# Patient Record
Sex: Female | Born: 1940
Health system: Southern US, Community
[De-identification: ages and names within clinical notes are randomized; demographics above are authoritative.]

## PROBLEM LIST (undated history)

## (undated) DIAGNOSIS — E079 Disorder of thyroid, unspecified: Secondary | ICD-10-CM

## (undated) DIAGNOSIS — H1010 Acute atopic conjunctivitis, unspecified eye: Secondary | ICD-10-CM

## (undated) DIAGNOSIS — I714 Abdominal aortic aneurysm, without rupture, unspecified: Secondary | ICD-10-CM

## (undated) DIAGNOSIS — F32A Depression, unspecified: Secondary | ICD-10-CM

## (undated) DIAGNOSIS — F329 Major depressive disorder, single episode, unspecified: Secondary | ICD-10-CM

## (undated) DIAGNOSIS — L219 Seborrheic dermatitis, unspecified: Secondary | ICD-10-CM

## (undated) DIAGNOSIS — S22000A Wedge compression fracture of unspecified thoracic vertebra, initial encounter for closed fracture: Secondary | ICD-10-CM

## (undated) DIAGNOSIS — J309 Allergic rhinitis, unspecified: Secondary | ICD-10-CM

## (undated) DIAGNOSIS — C801 Malignant (primary) neoplasm, unspecified: Secondary | ICD-10-CM

## (undated) DIAGNOSIS — E039 Hypothyroidism, unspecified: Secondary | ICD-10-CM

## (undated) DIAGNOSIS — E785 Hyperlipidemia, unspecified: Secondary | ICD-10-CM

## (undated) DIAGNOSIS — I10 Essential (primary) hypertension: Secondary | ICD-10-CM

## (undated) DIAGNOSIS — M069 Rheumatoid arthritis, unspecified: Secondary | ICD-10-CM

## (undated) HISTORY — DX: Hyperlipidemia, unspecified: E78.5

## (undated) HISTORY — DX: Acute atopic conjunctivitis, unspecified eye: H10.10

## (undated) HISTORY — PX: APPENDECTOMY: SHX54

## (undated) HISTORY — DX: Seborrheic dermatitis, unspecified: L21.9

## (undated) HISTORY — DX: Malignant (primary) neoplasm, unspecified: C80.1

## (undated) HISTORY — PX: BACK SURGERY: SHX140

## (undated) HISTORY — DX: Allergic rhinitis, unspecified: J30.9

## (undated) HISTORY — PX: OTHER SURGICAL HISTORY: SHX169

## (undated) HISTORY — DX: Wedge compression fracture of unspecified thoracic vertebra, initial encounter for closed fracture: S22.000A

## (undated) HISTORY — DX: Rheumatoid arthritis, unspecified: M06.9

## (undated) HISTORY — DX: Hypothyroidism, unspecified: E03.9

---

## 2006-02-22 DIAGNOSIS — E039 Hypothyroidism, unspecified: Secondary | ICD-10-CM

## 2006-02-22 HISTORY — DX: Hypothyroidism, unspecified: E03.9

## 2008-11-07 ENCOUNTER — Emergency Department (HOSPITAL_COMMUNITY): Admission: EM | Admit: 2008-11-07 | Discharge: 2008-11-07 | Payer: Self-pay | Admitting: Emergency Medicine

## 2009-09-02 ENCOUNTER — Ambulatory Visit (HOSPITAL_BASED_OUTPATIENT_CLINIC_OR_DEPARTMENT_OTHER): Admission: RE | Admit: 2009-09-02 | Discharge: 2009-09-02 | Payer: Self-pay | Admitting: Surgery

## 2011-01-20 LAB — POCT I-STAT 4, (NA,K, GLUC, HGB,HCT)
Glucose, Bld: 80 mg/dL (ref 70–99)
HCT: 35 % — ABNORMAL LOW (ref 36.0–46.0)
Hemoglobin: 11.9 g/dL — ABNORMAL LOW (ref 12.0–15.0)
Potassium: 3.5 mEq/L (ref 3.5–5.1)
Sodium: 140 mEq/L (ref 135–145)

## 2012-04-06 DIAGNOSIS — I10 Essential (primary) hypertension: Secondary | ICD-10-CM | POA: Diagnosis not present

## 2012-04-06 DIAGNOSIS — E039 Hypothyroidism, unspecified: Secondary | ICD-10-CM | POA: Diagnosis not present

## 2012-04-06 DIAGNOSIS — Z6828 Body mass index (BMI) 28.0-28.9, adult: Secondary | ICD-10-CM | POA: Diagnosis not present

## 2012-04-06 DIAGNOSIS — N390 Urinary tract infection, site not specified: Secondary | ICD-10-CM | POA: Diagnosis not present

## 2012-04-06 DIAGNOSIS — Z0000 Encounter for general adult medical examination without abnormal findings: Secondary | ICD-10-CM | POA: Diagnosis not present

## 2012-05-08 DIAGNOSIS — E7800 Pure hypercholesterolemia, unspecified: Secondary | ICD-10-CM | POA: Diagnosis not present

## 2012-05-08 DIAGNOSIS — E876 Hypokalemia: Secondary | ICD-10-CM | POA: Diagnosis not present

## 2012-05-16 DIAGNOSIS — H524 Presbyopia: Secondary | ICD-10-CM | POA: Diagnosis not present

## 2012-05-16 DIAGNOSIS — H2510 Age-related nuclear cataract, unspecified eye: Secondary | ICD-10-CM | POA: Diagnosis not present

## 2012-07-10 DIAGNOSIS — W57XXXA Bitten or stung by nonvenomous insect and other nonvenomous arthropods, initial encounter: Secondary | ICD-10-CM | POA: Diagnosis not present

## 2012-07-10 DIAGNOSIS — T148 Other injury of unspecified body region: Secondary | ICD-10-CM | POA: Diagnosis not present

## 2012-07-31 DIAGNOSIS — H60399 Other infective otitis externa, unspecified ear: Secondary | ICD-10-CM | POA: Diagnosis not present

## 2012-07-31 DIAGNOSIS — H9190 Unspecified hearing loss, unspecified ear: Secondary | ICD-10-CM | POA: Diagnosis not present

## 2012-07-31 DIAGNOSIS — H9209 Otalgia, unspecified ear: Secondary | ICD-10-CM | POA: Diagnosis not present

## 2012-07-31 DIAGNOSIS — J342 Deviated nasal septum: Secondary | ICD-10-CM | POA: Diagnosis not present

## 2012-08-04 DIAGNOSIS — H60399 Other infective otitis externa, unspecified ear: Secondary | ICD-10-CM | POA: Diagnosis not present

## 2012-08-04 DIAGNOSIS — H9209 Otalgia, unspecified ear: Secondary | ICD-10-CM | POA: Diagnosis not present

## 2012-08-09 DIAGNOSIS — Z23 Encounter for immunization: Secondary | ICD-10-CM | POA: Diagnosis not present

## 2012-08-09 DIAGNOSIS — J019 Acute sinusitis, unspecified: Secondary | ICD-10-CM | POA: Diagnosis not present

## 2012-08-09 DIAGNOSIS — J209 Acute bronchitis, unspecified: Secondary | ICD-10-CM | POA: Diagnosis not present

## 2012-08-30 DIAGNOSIS — H60399 Other infective otitis externa, unspecified ear: Secondary | ICD-10-CM | POA: Diagnosis not present

## 2012-08-30 DIAGNOSIS — R42 Dizziness and giddiness: Secondary | ICD-10-CM | POA: Diagnosis not present

## 2012-09-26 DIAGNOSIS — H00039 Abscess of eyelid unspecified eye, unspecified eyelid: Secondary | ICD-10-CM | POA: Diagnosis not present

## 2012-09-26 DIAGNOSIS — H1030 Unspecified acute conjunctivitis, unspecified eye: Secondary | ICD-10-CM | POA: Diagnosis not present

## 2012-09-26 DIAGNOSIS — H60399 Other infective otitis externa, unspecified ear: Secondary | ICD-10-CM | POA: Diagnosis not present

## 2012-10-06 DIAGNOSIS — E782 Mixed hyperlipidemia: Secondary | ICD-10-CM | POA: Diagnosis not present

## 2012-10-06 DIAGNOSIS — I10 Essential (primary) hypertension: Secondary | ICD-10-CM | POA: Diagnosis not present

## 2012-10-06 DIAGNOSIS — E039 Hypothyroidism, unspecified: Secondary | ICD-10-CM | POA: Diagnosis not present

## 2012-10-06 DIAGNOSIS — F329 Major depressive disorder, single episode, unspecified: Secondary | ICD-10-CM | POA: Diagnosis not present

## 2012-10-06 DIAGNOSIS — E7800 Pure hypercholesterolemia, unspecified: Secondary | ICD-10-CM | POA: Diagnosis not present

## 2012-10-31 DIAGNOSIS — E782 Mixed hyperlipidemia: Secondary | ICD-10-CM | POA: Diagnosis not present

## 2012-10-31 DIAGNOSIS — J019 Acute sinusitis, unspecified: Secondary | ICD-10-CM | POA: Diagnosis not present

## 2012-11-28 DIAGNOSIS — J019 Acute sinusitis, unspecified: Secondary | ICD-10-CM | POA: Diagnosis not present

## 2013-02-16 DIAGNOSIS — J3489 Other specified disorders of nose and nasal sinuses: Secondary | ICD-10-CM | POA: Diagnosis not present

## 2013-02-16 DIAGNOSIS — H60399 Other infective otitis externa, unspecified ear: Secondary | ICD-10-CM | POA: Diagnosis not present

## 2013-04-06 DIAGNOSIS — E039 Hypothyroidism, unspecified: Secondary | ICD-10-CM | POA: Diagnosis not present

## 2013-04-06 DIAGNOSIS — I10 Essential (primary) hypertension: Secondary | ICD-10-CM | POA: Diagnosis not present

## 2013-04-06 DIAGNOSIS — F329 Major depressive disorder, single episode, unspecified: Secondary | ICD-10-CM | POA: Diagnosis not present

## 2013-04-06 DIAGNOSIS — E782 Mixed hyperlipidemia: Secondary | ICD-10-CM | POA: Diagnosis not present

## 2013-04-23 DIAGNOSIS — M2550 Pain in unspecified joint: Secondary | ICD-10-CM | POA: Diagnosis not present

## 2013-04-23 DIAGNOSIS — W57XXXA Bitten or stung by nonvenomous insect and other nonvenomous arthropods, initial encounter: Secondary | ICD-10-CM | POA: Diagnosis not present

## 2013-04-23 DIAGNOSIS — T148 Other injury of unspecified body region: Secondary | ICD-10-CM | POA: Diagnosis not present

## 2013-04-23 DIAGNOSIS — M719 Bursopathy, unspecified: Secondary | ICD-10-CM | POA: Diagnosis not present

## 2013-04-23 DIAGNOSIS — M67919 Unspecified disorder of synovium and tendon, unspecified shoulder: Secondary | ICD-10-CM | POA: Diagnosis not present

## 2013-04-23 DIAGNOSIS — R5381 Other malaise: Secondary | ICD-10-CM | POA: Diagnosis not present

## 2013-04-23 DIAGNOSIS — R5383 Other fatigue: Secondary | ICD-10-CM | POA: Diagnosis not present

## 2013-05-01 DIAGNOSIS — M719 Bursopathy, unspecified: Secondary | ICD-10-CM | POA: Diagnosis not present

## 2013-05-01 DIAGNOSIS — M2550 Pain in unspecified joint: Secondary | ICD-10-CM | POA: Diagnosis not present

## 2013-05-01 DIAGNOSIS — M67919 Unspecified disorder of synovium and tendon, unspecified shoulder: Secondary | ICD-10-CM | POA: Diagnosis not present

## 2013-05-01 DIAGNOSIS — Z79899 Other long term (current) drug therapy: Secondary | ICD-10-CM | POA: Diagnosis not present

## 2013-05-01 DIAGNOSIS — IMO0001 Reserved for inherently not codable concepts without codable children: Secondary | ICD-10-CM | POA: Diagnosis not present

## 2013-05-01 DIAGNOSIS — R5381 Other malaise: Secondary | ICD-10-CM | POA: Diagnosis not present

## 2013-05-01 DIAGNOSIS — R5383 Other fatigue: Secondary | ICD-10-CM | POA: Diagnosis not present

## 2013-05-06 ENCOUNTER — Emergency Department (HOSPITAL_COMMUNITY)
Admission: EM | Admit: 2013-05-06 | Discharge: 2013-05-06 | Disposition: A | Discharge status: Home / Self Care | Payer: Medicare Other | Attending: Emergency Medicine | Admitting: Emergency Medicine

## 2013-05-06 ENCOUNTER — Encounter (HOSPITAL_COMMUNITY): Payer: Self-pay | Admitting: Emergency Medicine

## 2013-05-06 DIAGNOSIS — Z85828 Personal history of other malignant neoplasm of skin: Secondary | ICD-10-CM | POA: Insufficient documentation

## 2013-05-06 DIAGNOSIS — M2540 Effusion, unspecified joint: Secondary | ICD-10-CM | POA: Insufficient documentation

## 2013-05-06 DIAGNOSIS — M25579 Pain in unspecified ankle and joints of unspecified foot: Secondary | ICD-10-CM | POA: Diagnosis not present

## 2013-05-06 DIAGNOSIS — Z87891 Personal history of nicotine dependence: Secondary | ICD-10-CM | POA: Diagnosis not present

## 2013-05-06 DIAGNOSIS — I10 Essential (primary) hypertension: Secondary | ICD-10-CM | POA: Diagnosis not present

## 2013-05-06 DIAGNOSIS — E079 Disorder of thyroid, unspecified: Secondary | ICD-10-CM | POA: Insufficient documentation

## 2013-05-06 DIAGNOSIS — E785 Hyperlipidemia, unspecified: Secondary | ICD-10-CM | POA: Insufficient documentation

## 2013-05-06 DIAGNOSIS — Z888 Allergy status to other drugs, medicaments and biological substances status: Secondary | ICD-10-CM | POA: Diagnosis not present

## 2013-05-06 DIAGNOSIS — M2550 Pain in unspecified joint: Secondary | ICD-10-CM | POA: Diagnosis not present

## 2013-05-06 DIAGNOSIS — F329 Major depressive disorder, single episode, unspecified: Secondary | ICD-10-CM | POA: Diagnosis not present

## 2013-05-06 DIAGNOSIS — Z91040 Latex allergy status: Secondary | ICD-10-CM | POA: Insufficient documentation

## 2013-05-06 DIAGNOSIS — K5900 Constipation, unspecified: Secondary | ICD-10-CM | POA: Insufficient documentation

## 2013-05-06 DIAGNOSIS — M25569 Pain in unspecified knee: Secondary | ICD-10-CM | POA: Diagnosis not present

## 2013-05-06 DIAGNOSIS — Z881 Allergy status to other antibiotic agents status: Secondary | ICD-10-CM | POA: Diagnosis not present

## 2013-05-06 DIAGNOSIS — F3289 Other specified depressive episodes: Secondary | ICD-10-CM | POA: Insufficient documentation

## 2013-05-06 DIAGNOSIS — Z882 Allergy status to sulfonamides status: Secondary | ICD-10-CM | POA: Insufficient documentation

## 2013-05-06 HISTORY — DX: Depression, unspecified: F32.A

## 2013-05-06 HISTORY — DX: Essential (primary) hypertension: I10

## 2013-05-06 HISTORY — DX: Disorder of thyroid, unspecified: E07.9

## 2013-05-06 HISTORY — DX: Major depressive disorder, single episode, unspecified: F32.9

## 2013-05-06 MED ORDER — OXYCODONE-ACETAMINOPHEN 5-325 MG PO TABS: 1.0000 | ORAL_TABLET | Freq: Four times a day (QID) | ORAL | Status: DC | PRN

## 2013-05-06 MED ORDER — ONDANSETRON 4 MG PO TBDP
8.0000 mg | ORAL_TABLET | Freq: Once | ORAL | Status: AC
  Administered 2013-05-06: 8 mg via ORAL
  Filled 2013-05-06: qty 2

## 2013-05-06 MED ORDER — PREDNISONE 20 MG PO TABS: ORAL_TABLET | ORAL | Status: DC

## 2013-05-06 MED ORDER — PREDNISONE 20 MG PO TABS
60.0000 mg | ORAL_TABLET | Freq: Once | ORAL | Status: AC
  Administered 2013-05-06: 60 mg via ORAL
  Filled 2013-05-06: qty 3

## 2013-05-06 MED ORDER — ONDANSETRON HCL 4 MG PO TABS: 4.0000 mg | ORAL_TABLET | Freq: Four times a day (QID) | ORAL | Status: DC

## 2013-05-06 MED ORDER — OXYCODONE-ACETAMINOPHEN 5-325 MG PO TABS
2.0000 | ORAL_TABLET | Freq: Once | ORAL | Status: AC
  Administered 2013-05-06: 2 via ORAL
  Filled 2013-05-06: qty 2

## 2013-05-06 NOTE — ED Notes (Signed)
Pt states pain to joints all over body. States pain/swelling has increased over the last week. 5/10 pain at the time. No obvious deformities noted. States spider bite to L leg several weeks ago, was seen at PCP for same. Pt is alert and oriented x4. No signs of distress noted at the time.

## 2013-05-06 NOTE — ED Provider Notes (Signed)
Medical screening examination/treatment/procedure(s) were conducted as a shared visit with non-physician practitioner(s) and myself.  I personally evaluated the patient during the encounter  Patient seen and examined. Joints are mildly inflamed. Will restart her corticosteroid and discharged home patient are as follows schedule with the rheumatologist  Toy Baker, MD 05/06/13 1118

## 2013-05-06 NOTE — ED Provider Notes (Signed)
History    CSN: [id number] Arrival date & time 05/06/13  1019  First MD Initiated Contact with Patient 05/06/13 1039     Chief Complaint  Patient presents with  . Joint Pain   (Consider location/radiation/quality/duration/timing/severity/associated sxs/prior Treatment) HPI Comments: Patient is a 72 year old female with history of hypertension, hyperlipidemia, hypothyroidism, skin cancer who presents today with joint pain diffusely in all of her joints. 2 weeks ago she was bitten by a suspected spider. The actual bites have healed. She was seen by her primary care physician and prescribed steroids and antibiotics. At that time she was worked up with CBC, ANA, sed rate, RMSF which she was told all came back normally. She initially began to feel better while on the 6 day taper of prednisone. She has been taking hydrocodone with minimal relief.  Her symptoms have gradually began to worsen in the past week. No fevers, chills, nausea, vomiting, abdominal pain, diarrhea. She does note her last BM was 3 days ago. She struggles with constipation.   The history is provided by the patient. No language interpreter was used.   Past Medical History  Diagnosis Date  . Hypertension   . Thyroid disease   . Depression    History reviewed. No pertinent past surgical history. No family history on file. History  Substance Use Topics  . Smoking status: Former Games developer  . Smokeless tobacco: Not on file  . Alcohol Use: No   OB History   Grav Para Term Preterm Abortions TAB SAB Ect Mult Living                 Review of Systems  Constitutional: Negative for fever and chills.  Respiratory: Negative for shortness of breath.   Cardiovascular: Negative for chest pain.  Gastrointestinal: Positive for constipation. Negative for nausea, vomiting and abdominal pain.  Musculoskeletal: Positive for myalgias, joint swelling and arthralgias.  Skin: Negative for rash.  All other systems reviewed and are  negative.    Allergies  Amoxicillin; Gemfibrozil; Latex; and Sulfa antibiotics  Home Medications  No current outpatient prescriptions on file. BP 148/82  Pulse 74  Temp(Src) 97.8 F (36.6 C) (Oral)  Resp 18  Ht 5\' 4"  (1.626 m)  Wt 159 lb (72.122 kg)  BMI 27.28 kg/m2  SpO2 100% Physical Exam  Nursing note and vitals reviewed. Constitutional: She is oriented to person, place, and time. She appears well-developed and well-nourished. No distress.  HENT:  Head: Normocephalic and atraumatic.  Right Ear: External ear normal.  Left Ear: External ear normal.  Nose: Nose normal.  Mouth/Throat: Oropharynx is clear and moist.  Eyes: Conjunctivae are normal.  Neck: Normal range of motion.  Cardiovascular: Normal rate, regular rhythm, normal heart sounds, intact distal pulses and normal pulses.   Pulmonary/Chest: Effort normal and breath sounds normal. No stridor. No respiratory distress. She has no wheezes. She has no rales.  Abdominal: Soft. She exhibits no distension.  Musculoskeletal: Normal range of motion.       Right knee: She exhibits swelling.       Left knee: She exhibits swelling.       Right ankle: She exhibits swelling.       Left ankle: She exhibits swelling.       Right hand: She exhibits tenderness and swelling. She exhibits normal capillary refill and no deformity.       Left hand: She exhibits tenderness and swelling. She exhibits normal capillary refill and no deformity.  Neurological: She is alert and  oriented to person, place, and time. She has normal strength.  Skin: Skin is warm and dry. No rash noted. She is not diaphoretic. No erythema.  Psychiatric: She has a normal mood and affect. Her behavior is normal.    ED Course  Procedures (including critical care time) Labs Reviewed - No data to display No results found. 1. Joint pain     MDM  Patient with diffuse joint pain and tenderness. Pain improved with steroids. Worked up with appropriate tests at [facility]  office. Currently on doxycyline. Will give additional steroids today. Recommend follow up with PCP and possible rheumatologist. Dr. Freida Busman evaluated patient and agrees with plan. Return instructions given. Vital signs stable for discharge. Patient / Family / Caregiver informed of clinical course, understand medical decision-making process, and agree with plan.   Mora Bellman, PA-C 05/06/13 1141

## 2013-05-06 NOTE — ED Provider Notes (Signed)
Medical screening examination/treatment/procedure(s) were performed by non-physician practitioner and as supervising physician I was immediately available for consultation/collaboration.  Toy Baker, MD 05/06/13 [phone]

## 2013-05-06 NOTE — ED Notes (Addendum)
Pt c/o pain and swelling to joints. Pt was bit by spider 2 weeks ago and was seen and treated by her PMD. Symptoms increased in past week. Pt also reports feeling fatigue.

## 2013-05-10 DIAGNOSIS — M2550 Pain in unspecified joint: Secondary | ICD-10-CM | POA: Diagnosis not present

## 2013-05-10 DIAGNOSIS — K625 Hemorrhage of anus and rectum: Secondary | ICD-10-CM | POA: Diagnosis not present

## 2013-05-10 DIAGNOSIS — K5900 Constipation, unspecified: Secondary | ICD-10-CM | POA: Diagnosis not present

## 2013-05-17 DIAGNOSIS — H524 Presbyopia: Secondary | ICD-10-CM | POA: Diagnosis not present

## 2013-05-17 DIAGNOSIS — H2589 Other age-related cataract: Secondary | ICD-10-CM | POA: Diagnosis not present

## 2013-05-22 DIAGNOSIS — M79609 Pain in unspecified limb: Secondary | ICD-10-CM | POA: Diagnosis not present

## 2013-05-22 DIAGNOSIS — M7989 Other specified soft tissue disorders: Secondary | ICD-10-CM | POA: Diagnosis not present

## 2013-05-22 DIAGNOSIS — M2550 Pain in unspecified joint: Secondary | ICD-10-CM | POA: Diagnosis not present

## 2013-06-13 DIAGNOSIS — M2550 Pain in unspecified joint: Secondary | ICD-10-CM | POA: Diagnosis not present

## 2013-06-13 DIAGNOSIS — M79609 Pain in unspecified limb: Secondary | ICD-10-CM | POA: Diagnosis not present

## 2013-06-13 DIAGNOSIS — M069 Rheumatoid arthritis, unspecified: Secondary | ICD-10-CM | POA: Diagnosis not present

## 2013-07-06 DIAGNOSIS — J019 Acute sinusitis, unspecified: Secondary | ICD-10-CM | POA: Diagnosis not present

## 2013-07-13 DIAGNOSIS — H353190 Nonexudative age-related macular degeneration, unspecified eye, stage unspecified: Secondary | ICD-10-CM | POA: Diagnosis not present

## 2013-07-13 DIAGNOSIS — H40039 Anatomical narrow angle, unspecified eye: Secondary | ICD-10-CM | POA: Diagnosis not present

## 2013-07-25 DIAGNOSIS — M069 Rheumatoid arthritis, unspecified: Secondary | ICD-10-CM | POA: Diagnosis not present

## 2013-07-31 DIAGNOSIS — H259 Unspecified age-related cataract: Secondary | ICD-10-CM | POA: Diagnosis not present

## 2013-07-31 DIAGNOSIS — H2589 Other age-related cataract: Secondary | ICD-10-CM | POA: Diagnosis not present

## 2013-07-31 DIAGNOSIS — I10 Essential (primary) hypertension: Secondary | ICD-10-CM | POA: Diagnosis not present

## 2013-07-31 DIAGNOSIS — E039 Hypothyroidism, unspecified: Secondary | ICD-10-CM | POA: Diagnosis not present

## 2013-07-31 DIAGNOSIS — Z87891 Personal history of nicotine dependence: Secondary | ICD-10-CM | POA: Diagnosis not present

## 2013-08-08 DIAGNOSIS — Z23 Encounter for immunization: Secondary | ICD-10-CM | POA: Diagnosis not present

## 2013-08-15 DIAGNOSIS — Z9181 History of falling: Secondary | ICD-10-CM | POA: Diagnosis not present

## 2013-08-15 DIAGNOSIS — R05 Cough: Secondary | ICD-10-CM | POA: Diagnosis not present

## 2013-08-15 DIAGNOSIS — R42 Dizziness and giddiness: Secondary | ICD-10-CM | POA: Diagnosis not present

## 2013-08-15 DIAGNOSIS — F329 Major depressive disorder, single episode, unspecified: Secondary | ICD-10-CM | POA: Diagnosis not present

## 2013-09-24 DIAGNOSIS — M069 Rheumatoid arthritis, unspecified: Secondary | ICD-10-CM | POA: Diagnosis not present

## 2013-10-09 DIAGNOSIS — I10 Essential (primary) hypertension: Secondary | ICD-10-CM | POA: Diagnosis not present

## 2013-10-09 DIAGNOSIS — Z79899 Other long term (current) drug therapy: Secondary | ICD-10-CM | POA: Diagnosis not present

## 2013-10-09 DIAGNOSIS — E782 Mixed hyperlipidemia: Secondary | ICD-10-CM | POA: Diagnosis not present

## 2013-10-09 DIAGNOSIS — Z0000 Encounter for general adult medical examination without abnormal findings: Secondary | ICD-10-CM | POA: Diagnosis not present

## 2013-10-09 DIAGNOSIS — E039 Hypothyroidism, unspecified: Secondary | ICD-10-CM | POA: Diagnosis not present

## 2013-10-09 DIAGNOSIS — G609 Hereditary and idiopathic neuropathy, unspecified: Secondary | ICD-10-CM | POA: Diagnosis not present

## 2013-12-11 DIAGNOSIS — Z79899 Other long term (current) drug therapy: Secondary | ICD-10-CM | POA: Diagnosis not present

## 2013-12-11 DIAGNOSIS — J019 Acute sinusitis, unspecified: Secondary | ICD-10-CM | POA: Diagnosis not present

## 2013-12-11 DIAGNOSIS — M069 Rheumatoid arthritis, unspecified: Secondary | ICD-10-CM | POA: Diagnosis not present

## 2014-01-23 DIAGNOSIS — M069 Rheumatoid arthritis, unspecified: Secondary | ICD-10-CM | POA: Diagnosis not present

## 2014-01-23 DIAGNOSIS — M2550 Pain in unspecified joint: Secondary | ICD-10-CM | POA: Diagnosis not present

## 2014-02-08 DIAGNOSIS — R252 Cramp and spasm: Secondary | ICD-10-CM | POA: Diagnosis not present

## 2014-02-14 DIAGNOSIS — H524 Presbyopia: Secondary | ICD-10-CM | POA: Diagnosis not present

## 2014-02-14 DIAGNOSIS — Z961 Presence of intraocular lens: Secondary | ICD-10-CM | POA: Diagnosis not present

## 2014-02-22 DIAGNOSIS — H1010 Acute atopic conjunctivitis, unspecified eye: Secondary | ICD-10-CM | POA: Diagnosis not present

## 2014-02-22 DIAGNOSIS — R197 Diarrhea, unspecified: Secondary | ICD-10-CM | POA: Diagnosis not present

## 2014-03-25 DIAGNOSIS — M069 Rheumatoid arthritis, unspecified: Secondary | ICD-10-CM | POA: Diagnosis not present

## 2014-03-25 DIAGNOSIS — M2550 Pain in unspecified joint: Secondary | ICD-10-CM | POA: Diagnosis not present

## 2014-04-09 DIAGNOSIS — E039 Hypothyroidism, unspecified: Secondary | ICD-10-CM | POA: Diagnosis not present

## 2014-04-09 DIAGNOSIS — F3289 Other specified depressive episodes: Secondary | ICD-10-CM | POA: Diagnosis not present

## 2014-04-09 DIAGNOSIS — I10 Essential (primary) hypertension: Secondary | ICD-10-CM | POA: Diagnosis not present

## 2014-04-09 DIAGNOSIS — E782 Mixed hyperlipidemia: Secondary | ICD-10-CM | POA: Diagnosis not present

## 2014-04-09 DIAGNOSIS — Z79899 Other long term (current) drug therapy: Secondary | ICD-10-CM | POA: Diagnosis not present

## 2014-04-09 DIAGNOSIS — F329 Major depressive disorder, single episode, unspecified: Secondary | ICD-10-CM | POA: Diagnosis not present

## 2014-04-10 DIAGNOSIS — H905 Unspecified sensorineural hearing loss: Secondary | ICD-10-CM | POA: Diagnosis not present

## 2014-04-10 DIAGNOSIS — R42 Dizziness and giddiness: Secondary | ICD-10-CM | POA: Diagnosis not present

## 2014-04-10 DIAGNOSIS — J342 Deviated nasal septum: Secondary | ICD-10-CM | POA: Diagnosis not present

## 2014-04-10 DIAGNOSIS — H8110 Benign paroxysmal vertigo, unspecified ear: Secondary | ICD-10-CM | POA: Diagnosis not present

## 2014-04-16 DIAGNOSIS — H8110 Benign paroxysmal vertigo, unspecified ear: Secondary | ICD-10-CM | POA: Diagnosis not present

## 2014-04-16 DIAGNOSIS — H903 Sensorineural hearing loss, bilateral: Secondary | ICD-10-CM | POA: Diagnosis not present

## 2014-04-16 DIAGNOSIS — R42 Dizziness and giddiness: Secondary | ICD-10-CM | POA: Diagnosis not present

## 2014-04-16 DIAGNOSIS — H9319 Tinnitus, unspecified ear: Secondary | ICD-10-CM | POA: Diagnosis not present

## 2014-04-18 DIAGNOSIS — H903 Sensorineural hearing loss, bilateral: Secondary | ICD-10-CM | POA: Diagnosis not present

## 2014-04-18 DIAGNOSIS — H8110 Benign paroxysmal vertigo, unspecified ear: Secondary | ICD-10-CM | POA: Diagnosis not present

## 2014-04-24 DIAGNOSIS — H903 Sensorineural hearing loss, bilateral: Secondary | ICD-10-CM | POA: Diagnosis not present

## 2014-04-24 DIAGNOSIS — R42 Dizziness and giddiness: Secondary | ICD-10-CM | POA: Diagnosis not present

## 2014-05-10 DIAGNOSIS — H903 Sensorineural hearing loss, bilateral: Secondary | ICD-10-CM | POA: Diagnosis not present

## 2014-05-10 DIAGNOSIS — H8110 Benign paroxysmal vertigo, unspecified ear: Secondary | ICD-10-CM | POA: Diagnosis not present

## 2014-05-10 DIAGNOSIS — J342 Deviated nasal septum: Secondary | ICD-10-CM | POA: Diagnosis not present

## 2014-05-14 DIAGNOSIS — J9600 Acute respiratory failure, unspecified whether with hypoxia or hypercapnia: Secondary | ICD-10-CM | POA: Diagnosis present

## 2014-05-14 DIAGNOSIS — R918 Other nonspecific abnormal finding of lung field: Secondary | ICD-10-CM | POA: Diagnosis not present

## 2014-05-14 DIAGNOSIS — R0789 Other chest pain: Secondary | ICD-10-CM | POA: Diagnosis not present

## 2014-05-14 DIAGNOSIS — J449 Chronic obstructive pulmonary disease, unspecified: Secondary | ICD-10-CM | POA: Diagnosis not present

## 2014-05-14 DIAGNOSIS — I10 Essential (primary) hypertension: Secondary | ICD-10-CM | POA: Diagnosis not present

## 2014-05-14 DIAGNOSIS — R0902 Hypoxemia: Secondary | ICD-10-CM | POA: Diagnosis not present

## 2014-05-14 DIAGNOSIS — R9431 Abnormal electrocardiogram [ECG] [EKG]: Secondary | ICD-10-CM | POA: Diagnosis not present

## 2014-05-14 DIAGNOSIS — J189 Pneumonia, unspecified organism: Secondary | ICD-10-CM | POA: Diagnosis not present

## 2014-05-14 DIAGNOSIS — F341 Dysthymic disorder: Secondary | ICD-10-CM | POA: Diagnosis not present

## 2014-05-14 DIAGNOSIS — Z23 Encounter for immunization: Secondary | ICD-10-CM | POA: Diagnosis not present

## 2014-05-14 DIAGNOSIS — R079 Chest pain, unspecified: Secondary | ICD-10-CM | POA: Diagnosis not present

## 2014-05-14 DIAGNOSIS — Z87891 Personal history of nicotine dependence: Secondary | ICD-10-CM | POA: Diagnosis not present

## 2014-05-14 DIAGNOSIS — J438 Other emphysema: Secondary | ICD-10-CM | POA: Diagnosis not present

## 2014-05-14 DIAGNOSIS — J984 Other disorders of lung: Secondary | ICD-10-CM | POA: Diagnosis not present

## 2014-05-14 DIAGNOSIS — R9389 Abnormal findings on diagnostic imaging of other specified body structures: Secondary | ICD-10-CM | POA: Diagnosis not present

## 2014-05-14 DIAGNOSIS — R6883 Chills (without fever): Secondary | ICD-10-CM | POA: Diagnosis not present

## 2014-05-14 DIAGNOSIS — R0602 Shortness of breath: Secondary | ICD-10-CM | POA: Diagnosis not present

## 2014-05-14 DIAGNOSIS — Z79899 Other long term (current) drug therapy: Secondary | ICD-10-CM | POA: Diagnosis not present

## 2014-05-14 DIAGNOSIS — R072 Precordial pain: Secondary | ICD-10-CM | POA: Diagnosis not present

## 2014-05-14 DIAGNOSIS — R930 Abnormal findings on diagnostic imaging of skull and head, not elsewhere classified: Secondary | ICD-10-CM | POA: Diagnosis not present

## 2014-05-14 DIAGNOSIS — E039 Hypothyroidism, unspecified: Secondary | ICD-10-CM | POA: Diagnosis not present

## 2014-05-21 DIAGNOSIS — J189 Pneumonia, unspecified organism: Secondary | ICD-10-CM | POA: Diagnosis not present

## 2014-05-21 DIAGNOSIS — K5900 Constipation, unspecified: Secondary | ICD-10-CM | POA: Diagnosis not present

## 2014-05-21 DIAGNOSIS — E871 Hypo-osmolality and hyponatremia: Secondary | ICD-10-CM | POA: Diagnosis not present

## 2014-05-23 DIAGNOSIS — J449 Chronic obstructive pulmonary disease, unspecified: Secondary | ICD-10-CM | POA: Diagnosis not present

## 2014-05-23 DIAGNOSIS — R918 Other nonspecific abnormal finding of lung field: Secondary | ICD-10-CM | POA: Diagnosis not present

## 2014-06-06 DIAGNOSIS — J3089 Other allergic rhinitis: Secondary | ICD-10-CM | POA: Diagnosis not present

## 2014-06-06 DIAGNOSIS — R918 Other nonspecific abnormal finding of lung field: Secondary | ICD-10-CM | POA: Diagnosis not present

## 2014-06-06 DIAGNOSIS — J45909 Unspecified asthma, uncomplicated: Secondary | ICD-10-CM | POA: Diagnosis not present

## 2014-06-14 DIAGNOSIS — J984 Other disorders of lung: Secondary | ICD-10-CM | POA: Diagnosis not present

## 2014-06-14 DIAGNOSIS — E039 Hypothyroidism, unspecified: Secondary | ICD-10-CM | POA: Diagnosis not present

## 2014-06-19 DIAGNOSIS — J309 Allergic rhinitis, unspecified: Secondary | ICD-10-CM | POA: Diagnosis not present

## 2014-06-19 DIAGNOSIS — R748 Abnormal levels of other serum enzymes: Secondary | ICD-10-CM | POA: Diagnosis not present

## 2014-06-19 DIAGNOSIS — K5900 Constipation, unspecified: Secondary | ICD-10-CM | POA: Diagnosis not present

## 2014-06-19 DIAGNOSIS — E039 Hypothyroidism, unspecified: Secondary | ICD-10-CM | POA: Diagnosis not present

## 2014-06-25 DIAGNOSIS — J3089 Other allergic rhinitis: Secondary | ICD-10-CM | POA: Diagnosis not present

## 2014-06-26 DIAGNOSIS — E870 Hyperosmolality and hypernatremia: Secondary | ICD-10-CM | POA: Diagnosis not present

## 2014-07-01 DIAGNOSIS — M549 Dorsalgia, unspecified: Secondary | ICD-10-CM | POA: Diagnosis not present

## 2014-07-01 DIAGNOSIS — M25549 Pain in joints of unspecified hand: Secondary | ICD-10-CM | POA: Diagnosis not present

## 2014-07-01 DIAGNOSIS — M545 Low back pain, unspecified: Secondary | ICD-10-CM | POA: Diagnosis not present

## 2014-07-01 DIAGNOSIS — M25559 Pain in unspecified hip: Secondary | ICD-10-CM | POA: Diagnosis not present

## 2014-07-01 DIAGNOSIS — M069 Rheumatoid arthritis, unspecified: Secondary | ICD-10-CM | POA: Diagnosis not present

## 2014-07-04 DIAGNOSIS — J309 Allergic rhinitis, unspecified: Secondary | ICD-10-CM | POA: Diagnosis not present

## 2014-07-04 DIAGNOSIS — H1045 Other chronic allergic conjunctivitis: Secondary | ICD-10-CM | POA: Diagnosis not present

## 2014-07-22 DIAGNOSIS — K5900 Constipation, unspecified: Secondary | ICD-10-CM | POA: Diagnosis not present

## 2014-07-22 DIAGNOSIS — R0781 Pleurodynia: Secondary | ICD-10-CM | POA: Diagnosis not present

## 2014-07-22 DIAGNOSIS — E039 Hypothyroidism, unspecified: Secondary | ICD-10-CM | POA: Diagnosis not present

## 2014-07-23 DIAGNOSIS — E039 Hypothyroidism, unspecified: Secondary | ICD-10-CM | POA: Diagnosis not present

## 2014-07-24 DIAGNOSIS — Z9181 History of falling: Secondary | ICD-10-CM | POA: Diagnosis not present

## 2014-07-24 DIAGNOSIS — Z86010 Personal history of colonic polyps: Secondary | ICD-10-CM | POA: Diagnosis not present

## 2014-07-24 DIAGNOSIS — K5900 Constipation, unspecified: Secondary | ICD-10-CM | POA: Diagnosis not present

## 2014-07-24 DIAGNOSIS — R0781 Pleurodynia: Secondary | ICD-10-CM | POA: Diagnosis not present

## 2014-07-25 DIAGNOSIS — S299XXA Unspecified injury of thorax, initial encounter: Secondary | ICD-10-CM | POA: Diagnosis not present

## 2014-07-25 DIAGNOSIS — R0781 Pleurodynia: Secondary | ICD-10-CM | POA: Diagnosis not present

## 2014-08-21 DIAGNOSIS — Z23 Encounter for immunization: Secondary | ICD-10-CM | POA: Diagnosis not present

## 2014-09-09 DIAGNOSIS — Z800 Family history of malignant neoplasm of digestive organs: Secondary | ICD-10-CM | POA: Diagnosis not present

## 2014-09-09 DIAGNOSIS — K5730 Diverticulosis of large intestine without perforation or abscess without bleeding: Secondary | ICD-10-CM | POA: Diagnosis not present

## 2014-09-09 DIAGNOSIS — Z86010 Personal history of colonic polyps: Secondary | ICD-10-CM | POA: Diagnosis not present

## 2014-09-09 DIAGNOSIS — D123 Benign neoplasm of transverse colon: Secondary | ICD-10-CM | POA: Diagnosis not present

## 2014-09-09 DIAGNOSIS — K635 Polyp of colon: Secondary | ICD-10-CM | POA: Diagnosis not present

## 2014-09-30 DIAGNOSIS — M545 Low back pain: Secondary | ICD-10-CM | POA: Diagnosis not present

## 2014-09-30 DIAGNOSIS — M0609 Rheumatoid arthritis without rheumatoid factor, multiple sites: Secondary | ICD-10-CM | POA: Diagnosis not present

## 2014-10-07 DIAGNOSIS — E782 Mixed hyperlipidemia: Secondary | ICD-10-CM | POA: Diagnosis not present

## 2014-10-07 DIAGNOSIS — F329 Major depressive disorder, single episode, unspecified: Secondary | ICD-10-CM | POA: Diagnosis not present

## 2014-10-07 DIAGNOSIS — L219 Seborrheic dermatitis, unspecified: Secondary | ICD-10-CM | POA: Diagnosis not present

## 2014-10-07 DIAGNOSIS — R748 Abnormal levels of other serum enzymes: Secondary | ICD-10-CM | POA: Diagnosis not present

## 2014-10-07 DIAGNOSIS — E039 Hypothyroidism, unspecified: Secondary | ICD-10-CM | POA: Diagnosis not present

## 2014-10-07 DIAGNOSIS — M069 Rheumatoid arthritis, unspecified: Secondary | ICD-10-CM | POA: Diagnosis not present

## 2014-10-07 DIAGNOSIS — R634 Abnormal weight loss: Secondary | ICD-10-CM | POA: Diagnosis not present

## 2014-10-07 DIAGNOSIS — I10 Essential (primary) hypertension: Secondary | ICD-10-CM | POA: Diagnosis not present

## 2014-11-04 DIAGNOSIS — J019 Acute sinusitis, unspecified: Secondary | ICD-10-CM | POA: Diagnosis not present

## 2014-11-04 DIAGNOSIS — Z23 Encounter for immunization: Secondary | ICD-10-CM | POA: Diagnosis not present

## 2014-11-12 DIAGNOSIS — Z1389 Encounter for screening for other disorder: Secondary | ICD-10-CM | POA: Diagnosis not present

## 2014-11-12 DIAGNOSIS — Z9181 History of falling: Secondary | ICD-10-CM | POA: Diagnosis not present

## 2014-11-12 DIAGNOSIS — D485 Neoplasm of uncertain behavior of skin: Secondary | ICD-10-CM | POA: Diagnosis not present

## 2014-11-12 DIAGNOSIS — H6090 Unspecified otitis externa, unspecified ear: Secondary | ICD-10-CM | POA: Diagnosis not present

## 2014-11-12 DIAGNOSIS — E039 Hypothyroidism, unspecified: Secondary | ICD-10-CM | POA: Diagnosis not present

## 2014-12-04 DIAGNOSIS — H6090 Unspecified otitis externa, unspecified ear: Secondary | ICD-10-CM | POA: Diagnosis not present

## 2014-12-04 DIAGNOSIS — J329 Chronic sinusitis, unspecified: Secondary | ICD-10-CM | POA: Diagnosis not present

## 2014-12-09 DIAGNOSIS — M545 Low back pain: Secondary | ICD-10-CM | POA: Diagnosis not present

## 2014-12-09 DIAGNOSIS — M0609 Rheumatoid arthritis without rheumatoid factor, multiple sites: Secondary | ICD-10-CM | POA: Diagnosis not present

## 2014-12-27 DIAGNOSIS — E039 Hypothyroidism, unspecified: Secondary | ICD-10-CM | POA: Diagnosis not present

## 2015-01-07 DIAGNOSIS — K219 Gastro-esophageal reflux disease without esophagitis: Secondary | ICD-10-CM | POA: Diagnosis not present

## 2015-01-07 DIAGNOSIS — R1310 Dysphagia, unspecified: Secondary | ICD-10-CM | POA: Diagnosis not present

## 2015-01-07 DIAGNOSIS — R05 Cough: Secondary | ICD-10-CM | POA: Diagnosis not present

## 2015-01-20 DIAGNOSIS — R05 Cough: Secondary | ICD-10-CM | POA: Diagnosis not present

## 2015-01-30 DIAGNOSIS — R05 Cough: Secondary | ICD-10-CM | POA: Diagnosis not present

## 2015-01-30 DIAGNOSIS — K219 Gastro-esophageal reflux disease without esophagitis: Secondary | ICD-10-CM | POA: Diagnosis not present

## 2015-01-30 DIAGNOSIS — R1310 Dysphagia, unspecified: Secondary | ICD-10-CM | POA: Diagnosis not present

## 2015-01-31 DIAGNOSIS — R05 Cough: Secondary | ICD-10-CM | POA: Diagnosis not present

## 2015-02-05 DIAGNOSIS — R05 Cough: Secondary | ICD-10-CM | POA: Diagnosis not present

## 2015-02-05 DIAGNOSIS — R1310 Dysphagia, unspecified: Secondary | ICD-10-CM | POA: Diagnosis not present

## 2015-02-17 DIAGNOSIS — H3531 Nonexudative age-related macular degeneration: Secondary | ICD-10-CM | POA: Diagnosis not present

## 2015-02-17 DIAGNOSIS — H25811 Combined forms of age-related cataract, right eye: Secondary | ICD-10-CM | POA: Diagnosis not present

## 2015-02-19 DIAGNOSIS — R1310 Dysphagia, unspecified: Secondary | ICD-10-CM | POA: Diagnosis not present

## 2015-02-19 DIAGNOSIS — K219 Gastro-esophageal reflux disease without esophagitis: Secondary | ICD-10-CM | POA: Diagnosis not present

## 2015-02-19 DIAGNOSIS — K222 Esophageal obstruction: Secondary | ICD-10-CM | POA: Diagnosis not present

## 2015-02-25 DIAGNOSIS — K222 Esophageal obstruction: Secondary | ICD-10-CM | POA: Diagnosis not present

## 2015-03-05 DIAGNOSIS — K648 Other hemorrhoids: Secondary | ICD-10-CM | POA: Diagnosis not present

## 2015-03-05 DIAGNOSIS — Z6824 Body mass index (BMI) 24.0-24.9, adult: Secondary | ICD-10-CM | POA: Diagnosis not present

## 2015-03-10 DIAGNOSIS — R0781 Pleurodynia: Secondary | ICD-10-CM | POA: Diagnosis not present

## 2015-03-10 DIAGNOSIS — M545 Low back pain: Secondary | ICD-10-CM | POA: Diagnosis not present

## 2015-03-10 DIAGNOSIS — M0609 Rheumatoid arthritis without rheumatoid factor, multiple sites: Secondary | ICD-10-CM | POA: Diagnosis not present

## 2015-03-25 ENCOUNTER — Institutional Professional Consult (permissible substitution): Payer: Medicare Other | Admitting: Critical Care Medicine

## 2015-04-04 DIAGNOSIS — G44209 Tension-type headache, unspecified, not intractable: Secondary | ICD-10-CM | POA: Diagnosis not present

## 2015-04-04 DIAGNOSIS — K921 Melena: Secondary | ICD-10-CM | POA: Diagnosis not present

## 2015-04-04 DIAGNOSIS — Z6824 Body mass index (BMI) 24.0-24.9, adult: Secondary | ICD-10-CM | POA: Diagnosis not present

## 2015-04-08 DIAGNOSIS — Z6823 Body mass index (BMI) 23.0-23.9, adult: Secondary | ICD-10-CM | POA: Diagnosis not present

## 2015-04-08 DIAGNOSIS — R1032 Left lower quadrant pain: Secondary | ICD-10-CM | POA: Diagnosis not present

## 2015-04-08 DIAGNOSIS — R51 Headache: Secondary | ICD-10-CM | POA: Diagnosis not present

## 2015-04-15 DIAGNOSIS — Z6823 Body mass index (BMI) 23.0-23.9, adult: Secondary | ICD-10-CM | POA: Diagnosis not present

## 2015-04-15 DIAGNOSIS — R42 Dizziness and giddiness: Secondary | ICD-10-CM | POA: Diagnosis not present

## 2015-04-15 DIAGNOSIS — E039 Hypothyroidism, unspecified: Secondary | ICD-10-CM | POA: Diagnosis not present

## 2015-04-15 DIAGNOSIS — E782 Mixed hyperlipidemia: Secondary | ICD-10-CM | POA: Diagnosis not present

## 2015-04-15 DIAGNOSIS — R51 Headache: Secondary | ICD-10-CM | POA: Diagnosis not present

## 2015-04-15 DIAGNOSIS — K921 Melena: Secondary | ICD-10-CM | POA: Diagnosis not present

## 2015-04-18 DIAGNOSIS — R9082 White matter disease, unspecified: Secondary | ICD-10-CM | POA: Diagnosis not present

## 2015-04-18 DIAGNOSIS — R51 Headache: Secondary | ICD-10-CM | POA: Diagnosis not present

## 2015-05-19 DIAGNOSIS — M542 Cervicalgia: Secondary | ICD-10-CM | POA: Diagnosis not present

## 2015-05-21 DIAGNOSIS — M542 Cervicalgia: Secondary | ICD-10-CM | POA: Diagnosis not present

## 2015-06-03 DIAGNOSIS — K120 Recurrent oral aphthae: Secondary | ICD-10-CM | POA: Diagnosis not present

## 2015-06-03 DIAGNOSIS — Z6824 Body mass index (BMI) 24.0-24.9, adult: Secondary | ICD-10-CM | POA: Diagnosis not present

## 2015-07-07 DIAGNOSIS — M25511 Pain in right shoulder: Secondary | ICD-10-CM | POA: Diagnosis not present

## 2015-07-07 DIAGNOSIS — M545 Low back pain: Secondary | ICD-10-CM | POA: Diagnosis not present

## 2015-07-07 DIAGNOSIS — Z79899 Other long term (current) drug therapy: Secondary | ICD-10-CM | POA: Diagnosis not present

## 2015-07-07 DIAGNOSIS — M069 Rheumatoid arthritis, unspecified: Secondary | ICD-10-CM | POA: Diagnosis not present

## 2015-07-21 DIAGNOSIS — K5900 Constipation, unspecified: Secondary | ICD-10-CM | POA: Diagnosis not present

## 2015-07-21 DIAGNOSIS — I10 Essential (primary) hypertension: Secondary | ICD-10-CM | POA: Diagnosis not present

## 2015-07-21 DIAGNOSIS — E039 Hypothyroidism, unspecified: Secondary | ICD-10-CM | POA: Diagnosis not present

## 2015-07-21 DIAGNOSIS — Z79899 Other long term (current) drug therapy: Secondary | ICD-10-CM | POA: Diagnosis not present

## 2015-07-21 DIAGNOSIS — Z6824 Body mass index (BMI) 24.0-24.9, adult: Secondary | ICD-10-CM | POA: Diagnosis not present

## 2015-07-21 DIAGNOSIS — K648 Other hemorrhoids: Secondary | ICD-10-CM | POA: Diagnosis not present

## 2015-07-21 DIAGNOSIS — M069 Rheumatoid arthritis, unspecified: Secondary | ICD-10-CM | POA: Diagnosis not present

## 2015-07-21 DIAGNOSIS — J019 Acute sinusitis, unspecified: Secondary | ICD-10-CM | POA: Diagnosis not present

## 2015-07-28 DIAGNOSIS — I714 Abdominal aortic aneurysm, without rupture: Secondary | ICD-10-CM | POA: Diagnosis not present

## 2015-07-28 DIAGNOSIS — R748 Abnormal levels of other serum enzymes: Secondary | ICD-10-CM | POA: Diagnosis not present

## 2015-08-21 ENCOUNTER — Encounter: Payer: Self-pay | Admitting: Surgery

## 2015-08-25 ENCOUNTER — Encounter: Payer: Self-pay | Admitting: Surgery

## 2015-08-25 ENCOUNTER — Ambulatory Visit (INDEPENDENT_AMBULATORY_CARE_PROVIDER_SITE_OTHER): Payer: Medicare Other | Admitting: Surgery

## 2015-08-25 VITALS — BP 118/82 | HR 87 | Temp 97.8°F | Resp 16 | Ht 64.0 in | Wt 145.0 lb

## 2015-08-25 DIAGNOSIS — I714 Abdominal aortic aneurysm, without rupture, unspecified: Secondary | ICD-10-CM

## 2015-08-25 NOTE — Progress Notes (Signed)
Patient name: Joanne Whitehead MRN: [id number] DOB: October 18, 1941 Sex: female   Referred by: Lonie Peak  Reason for referral:  Chief Complaint  Patient presents with  . New Evaluation    Ref. by Dr. Donnella Bi   C/O AAA   Pt has some discomfort in the abdomen, but not much.    HISTORY OF PRESENT ILLNESS: As is a 74 year old female who comes in today for evaluation of an abdominal aortic aneurysm.  The patient was sent for a abdominal ultrasound because of elevated LFTs.  Incidentally found was a 4.5 cm abdominal aortic aneurysm.  The patient is medically managed for hypertension.  She has a history of smoking.  She has attempted to take Crestor however had side effects.  Past Medical History  Diagnosis Date  . Hypertension   . Thyroid disease   . Depression     History reviewed. No pertinent past surgical history.  Social History   Social History  . Marital Status: Widowed    Spouse Name: N/A  . Number of Children: N/A  . Years of Education: N/A   Occupational History  . Not on file.   Social History Main Topics  . Smoking status: Former Games developer  . Smokeless tobacco: Never Used  . Alcohol Use: No  . Drug Use: No  . Sexual Activity: Not on file   Other Topics Concern  . Not on file   Social History Narrative    Family History  Problem Relation Age of Onset  . Cancer Mother   . Cancer Father   . Heart attack Sister   . Heart disease Sister     Before age 16  . Cancer Brother     Allergies as of 08/25/2015 - Review Complete 08/25/2015  Allergen Reaction Noted  . Penicillins Itching 08/25/2015  . Gemfibrozil Nausea And Vomiting 05/06/2013  . Latex Other (See Comments) 05/06/2013  . Amoxicillin Itching and Rash 05/06/2013  . Sulfa antibiotics Itching, Rash, and Other (See Comments) 05/06/2013    Current Outpatient Prescriptions on File Prior to Visit  Medication Sig Dispense Refill  . albuterol (PROVENTIL HFA;VENTOLIN HFA) 108 (90 BASE) MCG/ACT  inhaler Inhale 2 puffs into the lungs every 6 (six) hours as needed for wheezing or shortness of breath.    Marland Kitchen amitriptyline (ELAVIL) 25 MG tablet Take 50 mg by mouth at bedtime.    . hydrochlorothiazide (HYDRODIURIL) 25 MG tablet Take 25 mg by mouth daily.    Marland Kitchen HYDROcodone-acetaminophen (NORCO/VICODIN) 5-325 MG per tablet Take 1 tablet by mouth every 6 (six) hours as needed for pain.    Marland Kitchen levothyroxine (SYNTHROID, LEVOTHROID) 150 MCG tablet Take 300 mcg by mouth daily before breakfast.    . ondansetron (ZOFRAN) 4 MG tablet Take 1 tablet (4 mg total) by mouth every 6 (six) hours. 12 tablet 0  . oxyCODONE-acetaminophen (PERCOCET/ROXICET) 5-325 MG per tablet Take 1 tablet by mouth every 6 (six) hours as needed for pain. 12 tablet 0  . potassium chloride (K-DUR) 10 MEQ tablet Take 10 mEq by mouth daily.    . clotrimazole-betamethasone (LOTRISONE) cream Apply 1 application topically daily as needed (to external ear region for tinea (Fungal infection)).    Marland Kitchen loratadine (CLARITIN) 10 MG tablet Take 10 mg by mouth every other day. Can take one (1) tablet twice a day for itching.    . meclizine (ANTIVERT) 25 MG tablet Take 25 mg by mouth 3 (three) times daily as needed for dizziness.    Marland Kitchen  mupirocin ointment (BACTROBAN) 2 % Apply 1 application topically 3 (three) times daily.    . naproxen sodium (ANAPROX) 220 MG tablet Take 220 mg by mouth 2 (two) times daily with a meal.    . omeprazole (PRILOSEC) 20 MG capsule Take 20 mg by mouth daily.    . predniSONE (DELTASONE) 20 MG tablet 3 tabs po daily x 3 days, then 2 tabs x 3 days, then 1.5 tabs x 3 days, then 1 tab x 3 days, then 0.5 tabs x 3 days (Patient not taking: Reported on 08/25/2015) 27 tablet 0   No current facility-administered medications on file prior to visit.     REVIEW OF SYSTEMS: Cardiovascular: No chest pain, chest pressure, palpitations, orthopnea, or dyspnea on exertion. No claudication or rest pain,  No history of DVT or  phlebitis. Pulmonary: No productive cough, asthma or wheezing. Neurologic: No weakness, paresthesias, aphasia, or amaurosis. Positive for dizziness. Hematologic: No bleeding problems or clotting disorders. Musculoskeletal: No joint pain or joint swelling. Gastrointestinal: No blood in stool or hematemesis Genitourinary: No dysuria or hematuria. Psychiatric:: No history of major depression. Integumentary: No rashes or ulcers. Constitutional: No fever or chills.  PHYSICAL EXAMINATION:  Filed Vitals:   08/25/15 1150  BP: 118/82  Pulse: 87  Temp: 97.8 F (36.6 C)  TempSrc: Oral  Resp: 16  Height: 5\' 4"  (1.626 m)  Weight: 145 lb (65.772 kg)  SpO2: 100%   Body mass index is 24.88 kg/(m^2). General: The patient appears their stated age.   HEENT:  No gross abnormalities Pulmonary: Respirations are non-labored Abdomen: Soft and non-tender .  Positive pulsatile mass Musculoskeletal: There are no major deformities.   Neurologic: No focal weakness or paresthesias are detected, Skin: There are no ulcer or rashes noted. Psychiatric: The patient has normal affect. Cardiovascular: There is a regular rate and rhythm without significant murmur appreciated.no carotid bruits.  I cannot palpate pedal pulses  Diagnostic Studies: I have reviewed her ultrasound which reveals a 4.5 cm abdominal aortic aneurysm    Assessment:  Abdominal aortic aneurysm Plan: I discussed the ultrasound findings with the patient.  I discussed treatment options and indications for repair which would likely be 5 cm.  I am going to have her come back in 6 months with a CT angiogram of the chest abdomen and pelvis to better evaluate her aortic diameter and to determine if she is a endovascular candidate.  I will also get preoperative carotid studies and lower extremity Doppler studies to evaluate for popliteal aneurysm.     Jorge Ny, M.D. [facility] Office:  [phone] Pager:  [phone]

## 2015-08-25 NOTE — Addendum Note (Signed)
Addended by: Dorthula Rue L on: 08/25/2015 05:25 PM   Modules accepted: Orders

## 2015-08-27 ENCOUNTER — Encounter: Payer: Self-pay | Admitting: Physician Assistant

## 2015-09-08 ENCOUNTER — Other Ambulatory Visit: Payer: Self-pay | Admitting: Rheumatology

## 2015-09-08 ENCOUNTER — Ambulatory Visit
Admission: RE | Admit: 2015-09-08 | Discharge: 2015-09-08 | Disposition: A | Discharge status: Home / Self Care | Payer: Medicare Other | Source: Ambulatory Visit | Attending: Rheumatology | Admitting: Rheumatology

## 2015-09-08 DIAGNOSIS — M79641 Pain in right hand: Secondary | ICD-10-CM | POA: Diagnosis not present

## 2015-09-08 DIAGNOSIS — M47816 Spondylosis without myelopathy or radiculopathy, lumbar region: Secondary | ICD-10-CM | POA: Diagnosis not present

## 2015-09-08 DIAGNOSIS — M47814 Spondylosis without myelopathy or radiculopathy, thoracic region: Secondary | ICD-10-CM | POA: Diagnosis not present

## 2015-09-08 DIAGNOSIS — Z79899 Other long term (current) drug therapy: Secondary | ICD-10-CM | POA: Diagnosis not present

## 2015-09-08 DIAGNOSIS — M545 Low back pain: Secondary | ICD-10-CM | POA: Diagnosis not present

## 2015-09-08 DIAGNOSIS — M25512 Pain in left shoulder: Secondary | ICD-10-CM | POA: Diagnosis not present

## 2015-09-08 DIAGNOSIS — M549 Dorsalgia, unspecified: Secondary | ICD-10-CM

## 2015-09-08 DIAGNOSIS — M0570 Rheumatoid arthritis with rheumatoid factor of unspecified site without organ or systems involvement: Secondary | ICD-10-CM | POA: Diagnosis not present

## 2015-09-08 DIAGNOSIS — M25511 Pain in right shoulder: Secondary | ICD-10-CM | POA: Diagnosis not present

## 2015-09-08 DIAGNOSIS — M79642 Pain in left hand: Secondary | ICD-10-CM | POA: Diagnosis not present

## 2015-09-15 DIAGNOSIS — E039 Hypothyroidism, unspecified: Secondary | ICD-10-CM | POA: Diagnosis not present

## 2015-09-15 DIAGNOSIS — R1084 Generalized abdominal pain: Secondary | ICD-10-CM | POA: Diagnosis not present

## 2015-09-15 DIAGNOSIS — Z6825 Body mass index (BMI) 25.0-25.9, adult: Secondary | ICD-10-CM | POA: Diagnosis not present

## 2015-09-15 DIAGNOSIS — K5900 Constipation, unspecified: Secondary | ICD-10-CM | POA: Diagnosis not present

## 2015-09-15 DIAGNOSIS — K625 Hemorrhage of anus and rectum: Secondary | ICD-10-CM | POA: Diagnosis not present

## 2015-09-19 DIAGNOSIS — E039 Hypothyroidism, unspecified: Secondary | ICD-10-CM | POA: Diagnosis not present

## 2015-10-01 DIAGNOSIS — S22000A Wedge compression fracture of unspecified thoracic vertebra, initial encounter for closed fracture: Secondary | ICD-10-CM

## 2015-10-01 HISTORY — DX: Wedge compression fracture of unspecified thoracic vertebra, initial encounter for closed fracture: S22.000A

## 2015-10-06 DIAGNOSIS — M25562 Pain in left knee: Secondary | ICD-10-CM | POA: Diagnosis not present

## 2015-10-06 DIAGNOSIS — M79641 Pain in right hand: Secondary | ICD-10-CM | POA: Diagnosis not present

## 2015-10-06 DIAGNOSIS — Z79899 Other long term (current) drug therapy: Secondary | ICD-10-CM | POA: Diagnosis not present

## 2015-10-06 DIAGNOSIS — M545 Low back pain: Secondary | ICD-10-CM | POA: Diagnosis not present

## 2015-10-06 DIAGNOSIS — M79642 Pain in left hand: Secondary | ICD-10-CM | POA: Diagnosis not present

## 2015-10-06 DIAGNOSIS — M0609 Rheumatoid arthritis without rheumatoid factor, multiple sites: Secondary | ICD-10-CM | POA: Diagnosis not present

## 2015-10-11 DIAGNOSIS — M549 Dorsalgia, unspecified: Secondary | ICD-10-CM | POA: Diagnosis not present

## 2015-10-11 DIAGNOSIS — M546 Pain in thoracic spine: Secondary | ICD-10-CM | POA: Diagnosis not present

## 2015-10-11 DIAGNOSIS — S299XXA Unspecified injury of thorax, initial encounter: Secondary | ICD-10-CM | POA: Diagnosis not present

## 2015-10-11 DIAGNOSIS — M545 Low back pain: Secondary | ICD-10-CM | POA: Diagnosis not present

## 2015-10-11 DIAGNOSIS — S2231XA Fracture of one rib, right side, initial encounter for closed fracture: Secondary | ICD-10-CM | POA: Diagnosis not present

## 2015-10-11 DIAGNOSIS — R079 Chest pain, unspecified: Secondary | ICD-10-CM | POA: Diagnosis not present

## 2015-10-11 DIAGNOSIS — R52 Pain, unspecified: Secondary | ICD-10-CM | POA: Diagnosis not present

## 2015-10-11 DIAGNOSIS — S22089A Unspecified fracture of T11-T12 vertebra, initial encounter for closed fracture: Secondary | ICD-10-CM | POA: Diagnosis not present

## 2015-10-11 DIAGNOSIS — S3992XA Unspecified injury of lower back, initial encounter: Secondary | ICD-10-CM | POA: Diagnosis not present

## 2015-10-11 DIAGNOSIS — T148 Other injury of unspecified body region: Secondary | ICD-10-CM | POA: Diagnosis not present

## 2015-10-14 DIAGNOSIS — Z9181 History of falling: Secondary | ICD-10-CM | POA: Diagnosis not present

## 2015-10-14 DIAGNOSIS — K5900 Constipation, unspecified: Secondary | ICD-10-CM | POA: Diagnosis not present

## 2015-10-14 DIAGNOSIS — N3946 Mixed incontinence: Secondary | ICD-10-CM | POA: Diagnosis not present

## 2015-10-14 DIAGNOSIS — S22000A Wedge compression fracture of unspecified thoracic vertebra, initial encounter for closed fracture: Secondary | ICD-10-CM | POA: Diagnosis not present

## 2015-10-14 DIAGNOSIS — S335XXA Sprain of ligaments of lumbar spine, initial encounter: Secondary | ICD-10-CM | POA: Diagnosis not present

## 2015-10-16 DIAGNOSIS — K5900 Constipation, unspecified: Secondary | ICD-10-CM | POA: Diagnosis not present

## 2015-10-16 DIAGNOSIS — I10 Essential (primary) hypertension: Secondary | ICD-10-CM | POA: Diagnosis not present

## 2015-10-16 DIAGNOSIS — F419 Anxiety disorder, unspecified: Secondary | ICD-10-CM | POA: Diagnosis not present

## 2015-10-16 DIAGNOSIS — E7800 Pure hypercholesterolemia, unspecified: Secondary | ICD-10-CM | POA: Diagnosis not present

## 2015-10-16 DIAGNOSIS — J439 Emphysema, unspecified: Secondary | ICD-10-CM | POA: Diagnosis not present

## 2015-10-16 DIAGNOSIS — E039 Hypothyroidism, unspecified: Secondary | ICD-10-CM | POA: Diagnosis not present

## 2015-10-16 DIAGNOSIS — R109 Unspecified abdominal pain: Secondary | ICD-10-CM | POA: Diagnosis not present

## 2015-10-16 DIAGNOSIS — Z79899 Other long term (current) drug therapy: Secondary | ICD-10-CM | POA: Diagnosis not present

## 2015-10-23 DIAGNOSIS — K5900 Constipation, unspecified: Secondary | ICD-10-CM | POA: Diagnosis not present

## 2015-10-23 DIAGNOSIS — Z23 Encounter for immunization: Secondary | ICD-10-CM | POA: Diagnosis not present

## 2015-10-23 DIAGNOSIS — Z6826 Body mass index (BMI) 26.0-26.9, adult: Secondary | ICD-10-CM | POA: Diagnosis not present

## 2015-10-23 DIAGNOSIS — S22000A Wedge compression fracture of unspecified thoracic vertebra, initial encounter for closed fracture: Secondary | ICD-10-CM | POA: Diagnosis not present

## 2015-11-04 DIAGNOSIS — S22000G Wedge compression fracture of unspecified thoracic vertebra, subsequent encounter for fracture with delayed healing: Secondary | ICD-10-CM | POA: Diagnosis not present

## 2015-11-04 DIAGNOSIS — M549 Dorsalgia, unspecified: Secondary | ICD-10-CM | POA: Diagnosis not present

## 2015-11-04 DIAGNOSIS — S22080A Wedge compression fracture of T11-T12 vertebra, initial encounter for closed fracture: Secondary | ICD-10-CM | POA: Diagnosis not present

## 2015-11-05 DIAGNOSIS — X58XXXA Exposure to other specified factors, initial encounter: Secondary | ICD-10-CM | POA: Diagnosis not present

## 2015-11-05 DIAGNOSIS — M5124 Other intervertebral disc displacement, thoracic region: Secondary | ICD-10-CM | POA: Diagnosis not present

## 2015-11-05 DIAGNOSIS — S22080A Wedge compression fracture of T11-T12 vertebra, initial encounter for closed fracture: Secondary | ICD-10-CM | POA: Diagnosis not present

## 2015-11-07 DIAGNOSIS — S22081A Stable burst fracture of T11-T12 vertebra, initial encounter for closed fracture: Secondary | ICD-10-CM | POA: Diagnosis not present

## 2015-11-10 DIAGNOSIS — Z87891 Personal history of nicotine dependence: Secondary | ICD-10-CM | POA: Diagnosis not present

## 2015-11-10 DIAGNOSIS — R2689 Other abnormalities of gait and mobility: Secondary | ICD-10-CM | POA: Diagnosis not present

## 2015-11-10 DIAGNOSIS — K222 Esophageal obstruction: Secondary | ICD-10-CM | POA: Diagnosis not present

## 2015-11-10 DIAGNOSIS — Z91040 Latex allergy status: Secondary | ICD-10-CM | POA: Diagnosis not present

## 2015-11-10 DIAGNOSIS — K219 Gastro-esophageal reflux disease without esophagitis: Secondary | ICD-10-CM | POA: Diagnosis present

## 2015-11-10 DIAGNOSIS — M4326 Fusion of spine, lumbar region: Secondary | ICD-10-CM | POA: Diagnosis not present

## 2015-11-10 DIAGNOSIS — Z85828 Personal history of other malignant neoplasm of skin: Secondary | ICD-10-CM | POA: Diagnosis not present

## 2015-11-10 DIAGNOSIS — S22081D Stable burst fracture of T11-T12 vertebra, subsequent encounter for fracture with routine healing: Secondary | ICD-10-CM | POA: Diagnosis not present

## 2015-11-10 DIAGNOSIS — J342 Deviated nasal septum: Secondary | ICD-10-CM | POA: Diagnosis not present

## 2015-11-10 DIAGNOSIS — I10 Essential (primary) hypertension: Secondary | ICD-10-CM | POA: Diagnosis present

## 2015-11-10 DIAGNOSIS — F329 Major depressive disorder, single episode, unspecified: Secondary | ICD-10-CM | POA: Diagnosis present

## 2015-11-10 DIAGNOSIS — R29898 Other symptoms and signs involving the musculoskeletal system: Secondary | ICD-10-CM | POA: Diagnosis not present

## 2015-11-10 DIAGNOSIS — H8110 Benign paroxysmal vertigo, unspecified ear: Secondary | ICD-10-CM | POA: Diagnosis not present

## 2015-11-10 DIAGNOSIS — S22081A Stable burst fracture of T11-T12 vertebra, initial encounter for closed fracture: Secondary | ICD-10-CM | POA: Diagnosis not present

## 2015-11-10 DIAGNOSIS — M545 Low back pain: Secondary | ICD-10-CM | POA: Diagnosis not present

## 2015-11-10 DIAGNOSIS — E039 Hypothyroidism, unspecified: Secondary | ICD-10-CM | POA: Diagnosis present

## 2015-11-10 DIAGNOSIS — Z880 Allergy status to penicillin: Secondary | ICD-10-CM | POA: Diagnosis not present

## 2015-11-10 DIAGNOSIS — S22080D Wedge compression fracture of T11-T12 vertebra, subsequent encounter for fracture with routine healing: Secondary | ICD-10-CM | POA: Diagnosis not present

## 2015-11-10 DIAGNOSIS — H903 Sensorineural hearing loss, bilateral: Secondary | ICD-10-CM | POA: Diagnosis present

## 2015-11-10 DIAGNOSIS — Z882 Allergy status to sulfonamides status: Secondary | ICD-10-CM | POA: Diagnosis not present

## 2015-11-10 DIAGNOSIS — Z888 Allergy status to other drugs, medicaments and biological substances status: Secondary | ICD-10-CM | POA: Diagnosis not present

## 2015-11-10 DIAGNOSIS — M069 Rheumatoid arthritis, unspecified: Secondary | ICD-10-CM | POA: Diagnosis present

## 2015-11-10 DIAGNOSIS — J449 Chronic obstructive pulmonary disease, unspecified: Secondary | ICD-10-CM | POA: Diagnosis present

## 2015-11-10 DIAGNOSIS — H9319 Tinnitus, unspecified ear: Secondary | ICD-10-CM | POA: Diagnosis present

## 2015-11-10 DIAGNOSIS — M40294 Other kyphosis, thoracic region: Secondary | ICD-10-CM | POA: Diagnosis not present

## 2015-11-10 DIAGNOSIS — R41841 Cognitive communication deficit: Secondary | ICD-10-CM | POA: Diagnosis not present

## 2015-11-10 DIAGNOSIS — Z79899 Other long term (current) drug therapy: Secondary | ICD-10-CM | POA: Diagnosis not present

## 2015-11-10 DIAGNOSIS — M4804 Spinal stenosis, thoracic region: Secondary | ICD-10-CM | POA: Diagnosis not present

## 2015-11-10 DIAGNOSIS — S22080A Wedge compression fracture of T11-T12 vertebra, initial encounter for closed fracture: Secondary | ICD-10-CM | POA: Diagnosis not present

## 2015-11-10 DIAGNOSIS — S22081G Stable burst fracture of T11-T12 vertebra, subsequent encounter for fracture with delayed healing: Secondary | ICD-10-CM | POA: Diagnosis not present

## 2015-11-10 DIAGNOSIS — J45909 Unspecified asthma, uncomplicated: Secondary | ICD-10-CM | POA: Diagnosis present

## 2015-11-13 DIAGNOSIS — K5909 Other constipation: Secondary | ICD-10-CM | POA: Diagnosis not present

## 2015-11-13 DIAGNOSIS — M545 Low back pain: Secondary | ICD-10-CM | POA: Diagnosis not present

## 2015-11-13 DIAGNOSIS — K222 Esophageal obstruction: Secondary | ICD-10-CM | POA: Diagnosis not present

## 2015-11-13 DIAGNOSIS — H903 Sensorineural hearing loss, bilateral: Secondary | ICD-10-CM | POA: Diagnosis not present

## 2015-11-13 DIAGNOSIS — J189 Pneumonia, unspecified organism: Secondary | ICD-10-CM | POA: Diagnosis not present

## 2015-11-13 DIAGNOSIS — J342 Deviated nasal septum: Secondary | ICD-10-CM | POA: Diagnosis not present

## 2015-11-13 DIAGNOSIS — Z9889 Other specified postprocedural states: Secondary | ICD-10-CM | POA: Diagnosis not present

## 2015-11-13 DIAGNOSIS — R2689 Other abnormalities of gait and mobility: Secondary | ICD-10-CM | POA: Diagnosis not present

## 2015-11-13 DIAGNOSIS — E038 Other specified hypothyroidism: Secondary | ICD-10-CM | POA: Diagnosis not present

## 2015-11-13 DIAGNOSIS — S22080D Wedge compression fracture of T11-T12 vertebra, subsequent encounter for fracture with routine healing: Secondary | ICD-10-CM | POA: Diagnosis not present

## 2015-11-13 DIAGNOSIS — R41841 Cognitive communication deficit: Secondary | ICD-10-CM | POA: Diagnosis not present

## 2015-11-13 DIAGNOSIS — S22081D Stable burst fracture of T11-T12 vertebra, subsequent encounter for fracture with routine healing: Secondary | ICD-10-CM | POA: Diagnosis not present

## 2015-11-13 DIAGNOSIS — I10 Essential (primary) hypertension: Secondary | ICD-10-CM | POA: Diagnosis not present

## 2015-11-13 DIAGNOSIS — R29898 Other symptoms and signs involving the musculoskeletal system: Secondary | ICD-10-CM | POA: Diagnosis not present

## 2015-11-13 DIAGNOSIS — M4326 Fusion of spine, lumbar region: Secondary | ICD-10-CM | POA: Diagnosis not present

## 2015-11-13 DIAGNOSIS — K219 Gastro-esophageal reflux disease without esophagitis: Secondary | ICD-10-CM | POA: Diagnosis not present

## 2015-11-13 DIAGNOSIS — H8110 Benign paroxysmal vertigo, unspecified ear: Secondary | ICD-10-CM | POA: Diagnosis not present

## 2015-11-13 DIAGNOSIS — K5900 Constipation, unspecified: Secondary | ICD-10-CM | POA: Diagnosis not present

## 2015-11-13 DIAGNOSIS — E039 Hypothyroidism, unspecified: Secondary | ICD-10-CM | POA: Diagnosis not present

## 2015-11-13 DIAGNOSIS — H9193 Unspecified hearing loss, bilateral: Secondary | ICD-10-CM | POA: Diagnosis not present

## 2015-11-13 DIAGNOSIS — M810 Age-related osteoporosis without current pathological fracture: Secondary | ICD-10-CM | POA: Diagnosis not present

## 2015-11-13 DIAGNOSIS — F329 Major depressive disorder, single episode, unspecified: Secondary | ICD-10-CM | POA: Diagnosis not present

## 2015-11-13 DIAGNOSIS — M0689 Other specified rheumatoid arthritis, multiple sites: Secondary | ICD-10-CM | POA: Diagnosis not present

## 2015-11-13 DIAGNOSIS — M6281 Muscle weakness (generalized): Secondary | ICD-10-CM | POA: Diagnosis not present

## 2015-11-16 DIAGNOSIS — M810 Age-related osteoporosis without current pathological fracture: Secondary | ICD-10-CM | POA: Diagnosis not present

## 2015-11-16 DIAGNOSIS — E038 Other specified hypothyroidism: Secondary | ICD-10-CM | POA: Diagnosis not present

## 2015-11-16 DIAGNOSIS — M545 Low back pain: Secondary | ICD-10-CM | POA: Diagnosis not present

## 2015-11-16 DIAGNOSIS — M0689 Other specified rheumatoid arthritis, multiple sites: Secondary | ICD-10-CM | POA: Diagnosis not present

## 2015-11-16 DIAGNOSIS — K5909 Other constipation: Secondary | ICD-10-CM | POA: Diagnosis not present

## 2015-11-16 DIAGNOSIS — H9193 Unspecified hearing loss, bilateral: Secondary | ICD-10-CM | POA: Diagnosis not present

## 2015-12-03 DIAGNOSIS — R42 Dizziness and giddiness: Secondary | ICD-10-CM | POA: Diagnosis not present

## 2015-12-03 DIAGNOSIS — M4325 Fusion of spine, thoracolumbar region: Secondary | ICD-10-CM | POA: Diagnosis not present

## 2015-12-03 DIAGNOSIS — M069 Rheumatoid arthritis, unspecified: Secondary | ICD-10-CM | POA: Diagnosis not present

## 2015-12-03 DIAGNOSIS — S22081D Stable burst fracture of T11-T12 vertebra, subsequent encounter for fracture with routine healing: Secondary | ICD-10-CM | POA: Diagnosis not present

## 2015-12-03 DIAGNOSIS — Z981 Arthrodesis status: Secondary | ICD-10-CM | POA: Diagnosis not present

## 2015-12-03 DIAGNOSIS — Z79899 Other long term (current) drug therapy: Secondary | ICD-10-CM | POA: Diagnosis not present

## 2015-12-03 DIAGNOSIS — J342 Deviated nasal septum: Secondary | ICD-10-CM | POA: Diagnosis not present

## 2015-12-03 DIAGNOSIS — R54 Age-related physical debility: Secondary | ICD-10-CM | POA: Diagnosis not present

## 2015-12-03 DIAGNOSIS — S22080D Wedge compression fracture of T11-T12 vertebra, subsequent encounter for fracture with routine healing: Secondary | ICD-10-CM | POA: Diagnosis not present

## 2015-12-04 DIAGNOSIS — J342 Deviated nasal septum: Secondary | ICD-10-CM | POA: Diagnosis not present

## 2015-12-04 DIAGNOSIS — M4325 Fusion of spine, thoracolumbar region: Secondary | ICD-10-CM | POA: Diagnosis not present

## 2015-12-04 DIAGNOSIS — S22081D Stable burst fracture of T11-T12 vertebra, subsequent encounter for fracture with routine healing: Secondary | ICD-10-CM | POA: Diagnosis not present

## 2015-12-04 DIAGNOSIS — S22080D Wedge compression fracture of T11-T12 vertebra, subsequent encounter for fracture with routine healing: Secondary | ICD-10-CM | POA: Diagnosis not present

## 2015-12-04 DIAGNOSIS — Z981 Arthrodesis status: Secondary | ICD-10-CM | POA: Diagnosis not present

## 2015-12-04 DIAGNOSIS — M069 Rheumatoid arthritis, unspecified: Secondary | ICD-10-CM | POA: Diagnosis not present

## 2015-12-08 DIAGNOSIS — S22081A Stable burst fracture of T11-T12 vertebra, initial encounter for closed fracture: Secondary | ICD-10-CM | POA: Diagnosis not present

## 2015-12-08 DIAGNOSIS — R634 Abnormal weight loss: Secondary | ICD-10-CM | POA: Diagnosis not present

## 2015-12-08 DIAGNOSIS — J189 Pneumonia, unspecified organism: Secondary | ICD-10-CM | POA: Diagnosis not present

## 2015-12-08 DIAGNOSIS — E7800 Pure hypercholesterolemia, unspecified: Secondary | ICD-10-CM | POA: Diagnosis not present

## 2015-12-08 DIAGNOSIS — E559 Vitamin D deficiency, unspecified: Secondary | ICD-10-CM | POA: Diagnosis not present

## 2015-12-08 DIAGNOSIS — K5909 Other constipation: Secondary | ICD-10-CM | POA: Diagnosis not present

## 2015-12-08 DIAGNOSIS — R197 Diarrhea, unspecified: Secondary | ICD-10-CM | POA: Diagnosis not present

## 2015-12-08 DIAGNOSIS — R0902 Hypoxemia: Secondary | ICD-10-CM | POA: Diagnosis not present

## 2015-12-08 DIAGNOSIS — J45909 Unspecified asthma, uncomplicated: Secondary | ICD-10-CM | POA: Diagnosis not present

## 2015-12-08 DIAGNOSIS — S22080A Wedge compression fracture of T11-T12 vertebra, initial encounter for closed fracture: Secondary | ICD-10-CM | POA: Diagnosis not present

## 2015-12-08 DIAGNOSIS — S22070D Wedge compression fracture of T9-T10 vertebra, subsequent encounter for fracture with routine healing: Secondary | ICD-10-CM | POA: Diagnosis not present

## 2015-12-08 DIAGNOSIS — M4324 Fusion of spine, thoracic region: Secondary | ICD-10-CM | POA: Diagnosis not present

## 2015-12-08 DIAGNOSIS — I10 Essential (primary) hypertension: Secondary | ICD-10-CM | POA: Diagnosis not present

## 2015-12-08 DIAGNOSIS — N390 Urinary tract infection, site not specified: Secondary | ICD-10-CM | POA: Diagnosis not present

## 2015-12-08 DIAGNOSIS — R100 Acute abdomen: Secondary | ICD-10-CM | POA: Diagnosis not present

## 2015-12-08 DIAGNOSIS — R0602 Shortness of breath: Secondary | ICD-10-CM | POA: Diagnosis not present

## 2015-12-08 DIAGNOSIS — J449 Chronic obstructive pulmonary disease, unspecified: Secondary | ICD-10-CM | POA: Diagnosis not present

## 2015-12-08 DIAGNOSIS — R279 Unspecified lack of coordination: Secondary | ICD-10-CM | POA: Diagnosis not present

## 2015-12-08 DIAGNOSIS — Z79899 Other long term (current) drug therapy: Secondary | ICD-10-CM | POA: Diagnosis not present

## 2015-12-08 DIAGNOSIS — M546 Pain in thoracic spine: Secondary | ICD-10-CM | POA: Diagnosis not present

## 2015-12-08 DIAGNOSIS — Z743 Need for continuous supervision: Secondary | ICD-10-CM | POA: Diagnosis not present

## 2015-12-08 DIAGNOSIS — R05 Cough: Secondary | ICD-10-CM | POA: Diagnosis not present

## 2015-12-08 DIAGNOSIS — F321 Major depressive disorder, single episode, moderate: Secondary | ICD-10-CM | POA: Diagnosis not present

## 2015-12-08 DIAGNOSIS — F411 Generalized anxiety disorder: Secondary | ICD-10-CM | POA: Diagnosis not present

## 2015-12-08 DIAGNOSIS — K5900 Constipation, unspecified: Secondary | ICD-10-CM | POA: Diagnosis not present

## 2015-12-08 DIAGNOSIS — M8589 Other specified disorders of bone density and structure, multiple sites: Secondary | ICD-10-CM | POA: Diagnosis not present

## 2015-12-08 DIAGNOSIS — G8929 Other chronic pain: Secondary | ICD-10-CM | POA: Diagnosis not present

## 2015-12-08 DIAGNOSIS — Z9889 Other specified postprocedural states: Secondary | ICD-10-CM | POA: Diagnosis not present

## 2015-12-08 DIAGNOSIS — M545 Low back pain: Secondary | ICD-10-CM | POA: Diagnosis not present

## 2015-12-08 DIAGNOSIS — E039 Hypothyroidism, unspecified: Secondary | ICD-10-CM | POA: Diagnosis not present

## 2015-12-08 DIAGNOSIS — M549 Dorsalgia, unspecified: Secondary | ICD-10-CM | POA: Diagnosis not present

## 2015-12-08 DIAGNOSIS — M6281 Muscle weakness (generalized): Secondary | ICD-10-CM | POA: Diagnosis not present

## 2015-12-08 DIAGNOSIS — R52 Pain, unspecified: Secondary | ICD-10-CM | POA: Diagnosis not present

## 2015-12-09 DIAGNOSIS — M4324 Fusion of spine, thoracic region: Secondary | ICD-10-CM | POA: Diagnosis not present

## 2015-12-09 DIAGNOSIS — I10 Essential (primary) hypertension: Secondary | ICD-10-CM | POA: Diagnosis not present

## 2015-12-09 DIAGNOSIS — R0902 Hypoxemia: Secondary | ICD-10-CM | POA: Diagnosis not present

## 2015-12-09 DIAGNOSIS — R634 Abnormal weight loss: Secondary | ICD-10-CM | POA: Diagnosis not present

## 2015-12-10 DIAGNOSIS — R0902 Hypoxemia: Secondary | ICD-10-CM | POA: Diagnosis not present

## 2015-12-10 DIAGNOSIS — J189 Pneumonia, unspecified organism: Secondary | ICD-10-CM | POA: Diagnosis not present

## 2015-12-10 DIAGNOSIS — E039 Hypothyroidism, unspecified: Secondary | ICD-10-CM | POA: Diagnosis not present

## 2015-12-10 DIAGNOSIS — I10 Essential (primary) hypertension: Secondary | ICD-10-CM | POA: Diagnosis not present

## 2015-12-16 DIAGNOSIS — M4324 Fusion of spine, thoracic region: Secondary | ICD-10-CM | POA: Diagnosis not present

## 2015-12-16 DIAGNOSIS — J189 Pneumonia, unspecified organism: Secondary | ICD-10-CM | POA: Diagnosis not present

## 2015-12-17 DIAGNOSIS — M545 Low back pain: Secondary | ICD-10-CM | POA: Diagnosis not present

## 2015-12-17 DIAGNOSIS — I10 Essential (primary) hypertension: Secondary | ICD-10-CM | POA: Diagnosis not present

## 2015-12-17 DIAGNOSIS — J189 Pneumonia, unspecified organism: Secondary | ICD-10-CM | POA: Diagnosis not present

## 2015-12-17 DIAGNOSIS — G8929 Other chronic pain: Secondary | ICD-10-CM | POA: Diagnosis not present

## 2015-12-17 DIAGNOSIS — R05 Cough: Secondary | ICD-10-CM | POA: Diagnosis not present

## 2015-12-17 DIAGNOSIS — E039 Hypothyroidism, unspecified: Secondary | ICD-10-CM | POA: Diagnosis not present

## 2015-12-17 DIAGNOSIS — M549 Dorsalgia, unspecified: Secondary | ICD-10-CM | POA: Diagnosis not present

## 2015-12-17 DIAGNOSIS — E7800 Pure hypercholesterolemia, unspecified: Secondary | ICD-10-CM | POA: Diagnosis not present

## 2015-12-17 DIAGNOSIS — K5909 Other constipation: Secondary | ICD-10-CM | POA: Diagnosis not present

## 2015-12-23 DIAGNOSIS — R197 Diarrhea, unspecified: Secondary | ICD-10-CM | POA: Diagnosis not present

## 2015-12-23 DIAGNOSIS — M4324 Fusion of spine, thoracic region: Secondary | ICD-10-CM | POA: Diagnosis not present

## 2015-12-23 DIAGNOSIS — R100 Acute abdomen: Secondary | ICD-10-CM | POA: Diagnosis not present

## 2015-12-24 DIAGNOSIS — S22080A Wedge compression fracture of T11-T12 vertebra, initial encounter for closed fracture: Secondary | ICD-10-CM | POA: Diagnosis not present

## 2015-12-24 DIAGNOSIS — S22081A Stable burst fracture of T11-T12 vertebra, initial encounter for closed fracture: Secondary | ICD-10-CM | POA: Diagnosis not present

## 2015-12-24 DIAGNOSIS — N390 Urinary tract infection, site not specified: Secondary | ICD-10-CM | POA: Diagnosis not present

## 2015-12-26 DIAGNOSIS — S22070D Wedge compression fracture of T9-T10 vertebra, subsequent encounter for fracture with routine healing: Secondary | ICD-10-CM | POA: Diagnosis not present

## 2015-12-26 DIAGNOSIS — R0902 Hypoxemia: Secondary | ICD-10-CM | POA: Diagnosis not present

## 2015-12-26 DIAGNOSIS — R100 Acute abdomen: Secondary | ICD-10-CM | POA: Diagnosis not present

## 2015-12-26 DIAGNOSIS — E559 Vitamin D deficiency, unspecified: Secondary | ICD-10-CM | POA: Diagnosis not present

## 2015-12-31 DIAGNOSIS — M8589 Other specified disorders of bone density and structure, multiple sites: Secondary | ICD-10-CM | POA: Diagnosis not present

## 2016-01-12 DIAGNOSIS — J449 Chronic obstructive pulmonary disease, unspecified: Secondary | ICD-10-CM | POA: Diagnosis not present

## 2016-01-12 DIAGNOSIS — I10 Essential (primary) hypertension: Secondary | ICD-10-CM | POA: Diagnosis not present

## 2016-01-12 DIAGNOSIS — J189 Pneumonia, unspecified organism: Secondary | ICD-10-CM | POA: Diagnosis not present

## 2016-01-12 DIAGNOSIS — E559 Vitamin D deficiency, unspecified: Secondary | ICD-10-CM | POA: Diagnosis not present

## 2016-01-13 DIAGNOSIS — M4325 Fusion of spine, thoracolumbar region: Secondary | ICD-10-CM | POA: Diagnosis not present

## 2016-01-13 DIAGNOSIS — S22080D Wedge compression fracture of T11-T12 vertebra, subsequent encounter for fracture with routine healing: Secondary | ICD-10-CM | POA: Diagnosis not present

## 2016-01-13 DIAGNOSIS — J342 Deviated nasal septum: Secondary | ICD-10-CM | POA: Diagnosis not present

## 2016-01-13 DIAGNOSIS — M069 Rheumatoid arthritis, unspecified: Secondary | ICD-10-CM | POA: Diagnosis not present

## 2016-01-13 DIAGNOSIS — Z981 Arthrodesis status: Secondary | ICD-10-CM | POA: Diagnosis not present

## 2016-01-13 DIAGNOSIS — S22081D Stable burst fracture of T11-T12 vertebra, subsequent encounter for fracture with routine healing: Secondary | ICD-10-CM | POA: Diagnosis not present

## 2016-01-19 DIAGNOSIS — J342 Deviated nasal septum: Secondary | ICD-10-CM | POA: Diagnosis not present

## 2016-01-19 DIAGNOSIS — S22000A Wedge compression fracture of unspecified thoracic vertebra, initial encounter for closed fracture: Secondary | ICD-10-CM | POA: Diagnosis not present

## 2016-01-19 DIAGNOSIS — R634 Abnormal weight loss: Secondary | ICD-10-CM | POA: Diagnosis not present

## 2016-01-19 DIAGNOSIS — M069 Rheumatoid arthritis, unspecified: Secondary | ICD-10-CM | POA: Diagnosis not present

## 2016-01-19 DIAGNOSIS — Z981 Arthrodesis status: Secondary | ICD-10-CM | POA: Diagnosis not present

## 2016-01-19 DIAGNOSIS — S22081D Stable burst fracture of T11-T12 vertebra, subsequent encounter for fracture with routine healing: Secondary | ICD-10-CM | POA: Diagnosis not present

## 2016-01-19 DIAGNOSIS — S22080D Wedge compression fracture of T11-T12 vertebra, subsequent encounter for fracture with routine healing: Secondary | ICD-10-CM | POA: Diagnosis not present

## 2016-01-19 DIAGNOSIS — M4325 Fusion of spine, thoracolumbar region: Secondary | ICD-10-CM | POA: Diagnosis not present

## 2016-01-19 DIAGNOSIS — Z6821 Body mass index (BMI) 21.0-21.9, adult: Secondary | ICD-10-CM | POA: Diagnosis not present

## 2016-01-19 DIAGNOSIS — N390 Urinary tract infection, site not specified: Secondary | ICD-10-CM | POA: Diagnosis not present

## 2016-01-20 ENCOUNTER — Ambulatory Visit
Admission: RE | Admit: 2016-01-20 | Discharge: 2016-01-20 | Disposition: A | Discharge status: Home / Self Care | Payer: Medicare Other | Source: Ambulatory Visit | Attending: Rheumatology | Admitting: Rheumatology

## 2016-01-20 ENCOUNTER — Other Ambulatory Visit: Payer: Self-pay | Admitting: Rheumatology

## 2016-01-20 DIAGNOSIS — S22081D Stable burst fracture of T11-T12 vertebra, subsequent encounter for fracture with routine healing: Secondary | ICD-10-CM | POA: Diagnosis not present

## 2016-01-20 DIAGNOSIS — M069 Rheumatoid arthritis, unspecified: Secondary | ICD-10-CM | POA: Diagnosis not present

## 2016-01-20 DIAGNOSIS — Z79899 Other long term (current) drug therapy: Secondary | ICD-10-CM | POA: Diagnosis not present

## 2016-01-20 DIAGNOSIS — M549 Dorsalgia, unspecified: Secondary | ICD-10-CM

## 2016-01-20 DIAGNOSIS — J342 Deviated nasal septum: Secondary | ICD-10-CM | POA: Diagnosis not present

## 2016-01-20 DIAGNOSIS — S22080D Wedge compression fracture of T11-T12 vertebra, subsequent encounter for fracture with routine healing: Secondary | ICD-10-CM | POA: Diagnosis not present

## 2016-01-20 DIAGNOSIS — M546 Pain in thoracic spine: Secondary | ICD-10-CM | POA: Diagnosis not present

## 2016-01-20 DIAGNOSIS — M4325 Fusion of spine, thoracolumbar region: Secondary | ICD-10-CM | POA: Diagnosis not present

## 2016-01-20 DIAGNOSIS — L0390 Cellulitis, unspecified: Secondary | ICD-10-CM | POA: Diagnosis not present

## 2016-01-20 DIAGNOSIS — M0609 Rheumatoid arthritis without rheumatoid factor, multiple sites: Secondary | ICD-10-CM | POA: Diagnosis not present

## 2016-01-20 DIAGNOSIS — Z981 Arthrodesis status: Secondary | ICD-10-CM | POA: Diagnosis not present

## 2016-01-20 DIAGNOSIS — M545 Low back pain: Secondary | ICD-10-CM | POA: Diagnosis not present

## 2016-01-20 DIAGNOSIS — M0570 Rheumatoid arthritis with rheumatoid factor of unspecified site without organ or systems involvement: Secondary | ICD-10-CM | POA: Diagnosis not present

## 2016-01-22 DIAGNOSIS — E039 Hypothyroidism, unspecified: Secondary | ICD-10-CM | POA: Diagnosis not present

## 2016-01-22 DIAGNOSIS — K5900 Constipation, unspecified: Secondary | ICD-10-CM | POA: Diagnosis not present

## 2016-01-23 DIAGNOSIS — M069 Rheumatoid arthritis, unspecified: Secondary | ICD-10-CM | POA: Diagnosis not present

## 2016-01-23 DIAGNOSIS — Z981 Arthrodesis status: Secondary | ICD-10-CM | POA: Diagnosis not present

## 2016-01-23 DIAGNOSIS — J342 Deviated nasal septum: Secondary | ICD-10-CM | POA: Diagnosis not present

## 2016-01-23 DIAGNOSIS — S22080D Wedge compression fracture of T11-T12 vertebra, subsequent encounter for fracture with routine healing: Secondary | ICD-10-CM | POA: Diagnosis not present

## 2016-01-23 DIAGNOSIS — S22081D Stable burst fracture of T11-T12 vertebra, subsequent encounter for fracture with routine healing: Secondary | ICD-10-CM | POA: Diagnosis not present

## 2016-01-23 DIAGNOSIS — M4325 Fusion of spine, thoracolumbar region: Secondary | ICD-10-CM | POA: Diagnosis not present

## 2016-01-27 DIAGNOSIS — M4325 Fusion of spine, thoracolumbar region: Secondary | ICD-10-CM | POA: Diagnosis not present

## 2016-01-27 DIAGNOSIS — S22080D Wedge compression fracture of T11-T12 vertebra, subsequent encounter for fracture with routine healing: Secondary | ICD-10-CM | POA: Diagnosis not present

## 2016-01-27 DIAGNOSIS — S22081D Stable burst fracture of T11-T12 vertebra, subsequent encounter for fracture with routine healing: Secondary | ICD-10-CM | POA: Diagnosis not present

## 2016-01-27 DIAGNOSIS — J342 Deviated nasal septum: Secondary | ICD-10-CM | POA: Diagnosis not present

## 2016-01-27 DIAGNOSIS — S22080A Wedge compression fracture of T11-T12 vertebra, initial encounter for closed fracture: Secondary | ICD-10-CM | POA: Diagnosis not present

## 2016-01-27 DIAGNOSIS — M069 Rheumatoid arthritis, unspecified: Secondary | ICD-10-CM | POA: Diagnosis not present

## 2016-01-27 DIAGNOSIS — Z981 Arthrodesis status: Secondary | ICD-10-CM | POA: Diagnosis not present

## 2016-01-27 DIAGNOSIS — S22081A Stable burst fracture of T11-T12 vertebra, initial encounter for closed fracture: Secondary | ICD-10-CM | POA: Diagnosis not present

## 2016-01-29 DIAGNOSIS — E039 Hypothyroidism, unspecified: Secondary | ICD-10-CM | POA: Diagnosis not present

## 2016-01-29 DIAGNOSIS — I10 Essential (primary) hypertension: Secondary | ICD-10-CM | POA: Insufficient documentation

## 2016-01-29 DIAGNOSIS — M069 Rheumatoid arthritis, unspecified: Secondary | ICD-10-CM | POA: Insufficient documentation

## 2016-01-29 DIAGNOSIS — F32A Depression, unspecified: Secondary | ICD-10-CM | POA: Insufficient documentation

## 2016-01-29 DIAGNOSIS — E785 Hyperlipidemia, unspecified: Secondary | ICD-10-CM | POA: Insufficient documentation

## 2016-01-29 DIAGNOSIS — K219 Gastro-esophageal reflux disease without esophagitis: Secondary | ICD-10-CM | POA: Insufficient documentation

## 2016-01-29 DIAGNOSIS — F329 Major depressive disorder, single episode, unspecified: Secondary | ICD-10-CM | POA: Insufficient documentation

## 2016-01-30 DIAGNOSIS — Z981 Arthrodesis status: Secondary | ICD-10-CM | POA: Diagnosis not present

## 2016-01-30 DIAGNOSIS — M069 Rheumatoid arthritis, unspecified: Secondary | ICD-10-CM | POA: Diagnosis not present

## 2016-01-30 DIAGNOSIS — J342 Deviated nasal septum: Secondary | ICD-10-CM | POA: Diagnosis not present

## 2016-01-30 DIAGNOSIS — M4325 Fusion of spine, thoracolumbar region: Secondary | ICD-10-CM | POA: Diagnosis not present

## 2016-01-30 DIAGNOSIS — S22080D Wedge compression fracture of T11-T12 vertebra, subsequent encounter for fracture with routine healing: Secondary | ICD-10-CM | POA: Diagnosis not present

## 2016-01-30 DIAGNOSIS — S22081D Stable burst fracture of T11-T12 vertebra, subsequent encounter for fracture with routine healing: Secondary | ICD-10-CM | POA: Diagnosis not present

## 2016-02-01 DIAGNOSIS — Z79899 Other long term (current) drug therapy: Secondary | ICD-10-CM | POA: Diagnosis not present

## 2016-02-01 DIAGNOSIS — R54 Age-related physical debility: Secondary | ICD-10-CM | POA: Diagnosis not present

## 2016-02-01 DIAGNOSIS — M069 Rheumatoid arthritis, unspecified: Secondary | ICD-10-CM | POA: Diagnosis not present

## 2016-02-01 DIAGNOSIS — R279 Unspecified lack of coordination: Secondary | ICD-10-CM | POA: Diagnosis not present

## 2016-02-01 DIAGNOSIS — S22080D Wedge compression fracture of T11-T12 vertebra, subsequent encounter for fracture with routine healing: Secondary | ICD-10-CM | POA: Diagnosis not present

## 2016-02-01 DIAGNOSIS — S22070D Wedge compression fracture of T9-T10 vertebra, subsequent encounter for fracture with routine healing: Secondary | ICD-10-CM | POA: Diagnosis not present

## 2016-02-01 DIAGNOSIS — J342 Deviated nasal septum: Secondary | ICD-10-CM | POA: Diagnosis not present

## 2016-02-01 DIAGNOSIS — M4325 Fusion of spine, thoracolumbar region: Secondary | ICD-10-CM | POA: Diagnosis not present

## 2016-02-01 DIAGNOSIS — R42 Dizziness and giddiness: Secondary | ICD-10-CM | POA: Diagnosis not present

## 2016-02-01 DIAGNOSIS — S22081D Stable burst fracture of T11-T12 vertebra, subsequent encounter for fracture with routine healing: Secondary | ICD-10-CM | POA: Diagnosis not present

## 2016-02-01 DIAGNOSIS — Z981 Arthrodesis status: Secondary | ICD-10-CM | POA: Diagnosis not present

## 2016-02-02 DIAGNOSIS — X58XXXA Exposure to other specified factors, initial encounter: Secondary | ICD-10-CM | POA: Diagnosis not present

## 2016-02-02 DIAGNOSIS — S22080A Wedge compression fracture of T11-T12 vertebra, initial encounter for closed fracture: Secondary | ICD-10-CM | POA: Diagnosis not present

## 2016-02-02 DIAGNOSIS — S22079A Unspecified fracture of T9-T10 vertebra, initial encounter for closed fracture: Secondary | ICD-10-CM | POA: Diagnosis not present

## 2016-02-02 DIAGNOSIS — Z981 Arthrodesis status: Secondary | ICD-10-CM | POA: Diagnosis not present

## 2016-02-02 DIAGNOSIS — S22070A Wedge compression fracture of T9-T10 vertebra, initial encounter for closed fracture: Secondary | ICD-10-CM | POA: Diagnosis not present

## 2016-02-02 DIAGNOSIS — M5126 Other intervertebral disc displacement, lumbar region: Secondary | ICD-10-CM | POA: Diagnosis not present

## 2016-02-03 DIAGNOSIS — S22081D Stable burst fracture of T11-T12 vertebra, subsequent encounter for fracture with routine healing: Secondary | ICD-10-CM | POA: Diagnosis not present

## 2016-02-03 DIAGNOSIS — J342 Deviated nasal septum: Secondary | ICD-10-CM | POA: Diagnosis not present

## 2016-02-03 DIAGNOSIS — S22070D Wedge compression fracture of T9-T10 vertebra, subsequent encounter for fracture with routine healing: Secondary | ICD-10-CM | POA: Diagnosis not present

## 2016-02-03 DIAGNOSIS — Z981 Arthrodesis status: Secondary | ICD-10-CM | POA: Diagnosis not present

## 2016-02-03 DIAGNOSIS — S22080D Wedge compression fracture of T11-T12 vertebra, subsequent encounter for fracture with routine healing: Secondary | ICD-10-CM | POA: Diagnosis not present

## 2016-02-03 DIAGNOSIS — M4325 Fusion of spine, thoracolumbar region: Secondary | ICD-10-CM | POA: Diagnosis not present

## 2016-02-05 DIAGNOSIS — Z981 Arthrodesis status: Secondary | ICD-10-CM | POA: Diagnosis not present

## 2016-02-05 DIAGNOSIS — S22070D Wedge compression fracture of T9-T10 vertebra, subsequent encounter for fracture with routine healing: Secondary | ICD-10-CM | POA: Diagnosis not present

## 2016-02-05 DIAGNOSIS — S22081D Stable burst fracture of T11-T12 vertebra, subsequent encounter for fracture with routine healing: Secondary | ICD-10-CM | POA: Diagnosis not present

## 2016-02-05 DIAGNOSIS — M4325 Fusion of spine, thoracolumbar region: Secondary | ICD-10-CM | POA: Diagnosis not present

## 2016-02-05 DIAGNOSIS — S22080D Wedge compression fracture of T11-T12 vertebra, subsequent encounter for fracture with routine healing: Secondary | ICD-10-CM | POA: Diagnosis not present

## 2016-02-05 DIAGNOSIS — J342 Deviated nasal septum: Secondary | ICD-10-CM | POA: Diagnosis not present

## 2016-02-10 DIAGNOSIS — S22081A Stable burst fracture of T11-T12 vertebra, initial encounter for closed fracture: Secondary | ICD-10-CM | POA: Diagnosis not present

## 2016-02-13 ENCOUNTER — Encounter: Payer: Self-pay | Admitting: Surgery

## 2016-02-13 DIAGNOSIS — J342 Deviated nasal septum: Secondary | ICD-10-CM | POA: Diagnosis not present

## 2016-02-13 DIAGNOSIS — S22081D Stable burst fracture of T11-T12 vertebra, subsequent encounter for fracture with routine healing: Secondary | ICD-10-CM | POA: Diagnosis not present

## 2016-02-13 DIAGNOSIS — Z981 Arthrodesis status: Secondary | ICD-10-CM | POA: Diagnosis not present

## 2016-02-13 DIAGNOSIS — M4325 Fusion of spine, thoracolumbar region: Secondary | ICD-10-CM | POA: Diagnosis not present

## 2016-02-13 DIAGNOSIS — S22070D Wedge compression fracture of T9-T10 vertebra, subsequent encounter for fracture with routine healing: Secondary | ICD-10-CM | POA: Diagnosis not present

## 2016-02-13 DIAGNOSIS — S22080D Wedge compression fracture of T11-T12 vertebra, subsequent encounter for fracture with routine healing: Secondary | ICD-10-CM | POA: Diagnosis not present

## 2016-02-16 ENCOUNTER — Other Ambulatory Visit: Payer: Medicare Other

## 2016-02-16 ENCOUNTER — Inpatient Hospital Stay: Admission: RE | Admit: 2016-02-16 | Payer: Medicare Other | Source: Ambulatory Visit

## 2016-02-16 DIAGNOSIS — H26492 Other secondary cataract, left eye: Secondary | ICD-10-CM | POA: Diagnosis not present

## 2016-02-16 DIAGNOSIS — H353131 Nonexudative age-related macular degeneration, bilateral, early dry stage: Secondary | ICD-10-CM | POA: Diagnosis not present

## 2016-02-16 DIAGNOSIS — H25811 Combined forms of age-related cataract, right eye: Secondary | ICD-10-CM | POA: Diagnosis not present

## 2016-02-18 DIAGNOSIS — M4325 Fusion of spine, thoracolumbar region: Secondary | ICD-10-CM | POA: Diagnosis not present

## 2016-02-18 DIAGNOSIS — S22081D Stable burst fracture of T11-T12 vertebra, subsequent encounter for fracture with routine healing: Secondary | ICD-10-CM | POA: Diagnosis not present

## 2016-02-18 DIAGNOSIS — S22080D Wedge compression fracture of T11-T12 vertebra, subsequent encounter for fracture with routine healing: Secondary | ICD-10-CM | POA: Diagnosis not present

## 2016-02-18 DIAGNOSIS — Z981 Arthrodesis status: Secondary | ICD-10-CM | POA: Diagnosis not present

## 2016-02-18 DIAGNOSIS — S22070D Wedge compression fracture of T9-T10 vertebra, subsequent encounter for fracture with routine healing: Secondary | ICD-10-CM | POA: Diagnosis not present

## 2016-02-18 DIAGNOSIS — J342 Deviated nasal septum: Secondary | ICD-10-CM | POA: Diagnosis not present

## 2016-02-19 ENCOUNTER — Other Ambulatory Visit: Payer: Medicare Other

## 2016-02-23 ENCOUNTER — Ambulatory Visit: Payer: Medicare Other | Admitting: Surgery

## 2016-02-23 ENCOUNTER — Encounter (HOSPITAL_COMMUNITY): Payer: Medicare Other

## 2016-02-23 DIAGNOSIS — M7531 Calcific tendinitis of right shoulder: Secondary | ICD-10-CM | POA: Diagnosis not present

## 2016-02-23 DIAGNOSIS — M542 Cervicalgia: Secondary | ICD-10-CM | POA: Diagnosis not present

## 2016-02-23 DIAGNOSIS — M25511 Pain in right shoulder: Secondary | ICD-10-CM | POA: Diagnosis not present

## 2016-02-23 DIAGNOSIS — M818 Other osteoporosis without current pathological fracture: Secondary | ICD-10-CM | POA: Diagnosis not present

## 2016-02-23 DIAGNOSIS — M0609 Rheumatoid arthritis without rheumatoid factor, multiple sites: Secondary | ICD-10-CM | POA: Diagnosis not present

## 2016-02-23 DIAGNOSIS — M545 Low back pain: Secondary | ICD-10-CM | POA: Diagnosis not present

## 2016-02-25 DIAGNOSIS — J342 Deviated nasal septum: Secondary | ICD-10-CM | POA: Diagnosis not present

## 2016-02-25 DIAGNOSIS — M4325 Fusion of spine, thoracolumbar region: Secondary | ICD-10-CM | POA: Diagnosis not present

## 2016-02-25 DIAGNOSIS — S22081D Stable burst fracture of T11-T12 vertebra, subsequent encounter for fracture with routine healing: Secondary | ICD-10-CM | POA: Diagnosis not present

## 2016-02-25 DIAGNOSIS — Z981 Arthrodesis status: Secondary | ICD-10-CM | POA: Diagnosis not present

## 2016-02-25 DIAGNOSIS — S22070D Wedge compression fracture of T9-T10 vertebra, subsequent encounter for fracture with routine healing: Secondary | ICD-10-CM | POA: Diagnosis not present

## 2016-02-25 DIAGNOSIS — S22080D Wedge compression fracture of T11-T12 vertebra, subsequent encounter for fracture with routine healing: Secondary | ICD-10-CM | POA: Diagnosis not present

## 2016-03-02 DIAGNOSIS — S22080D Wedge compression fracture of T11-T12 vertebra, subsequent encounter for fracture with routine healing: Secondary | ICD-10-CM | POA: Diagnosis not present

## 2016-03-02 DIAGNOSIS — M4325 Fusion of spine, thoracolumbar region: Secondary | ICD-10-CM | POA: Diagnosis not present

## 2016-03-02 DIAGNOSIS — J342 Deviated nasal septum: Secondary | ICD-10-CM | POA: Diagnosis not present

## 2016-03-02 DIAGNOSIS — S22081D Stable burst fracture of T11-T12 vertebra, subsequent encounter for fracture with routine healing: Secondary | ICD-10-CM | POA: Diagnosis not present

## 2016-03-02 DIAGNOSIS — S22070D Wedge compression fracture of T9-T10 vertebra, subsequent encounter for fracture with routine healing: Secondary | ICD-10-CM | POA: Diagnosis not present

## 2016-03-02 DIAGNOSIS — Z981 Arthrodesis status: Secondary | ICD-10-CM | POA: Diagnosis not present

## 2016-03-08 DIAGNOSIS — S22080D Wedge compression fracture of T11-T12 vertebra, subsequent encounter for fracture with routine healing: Secondary | ICD-10-CM | POA: Diagnosis not present

## 2016-03-08 DIAGNOSIS — S22070D Wedge compression fracture of T9-T10 vertebra, subsequent encounter for fracture with routine healing: Secondary | ICD-10-CM | POA: Diagnosis not present

## 2016-03-08 DIAGNOSIS — M4325 Fusion of spine, thoracolumbar region: Secondary | ICD-10-CM | POA: Diagnosis not present

## 2016-03-08 DIAGNOSIS — J342 Deviated nasal septum: Secondary | ICD-10-CM | POA: Diagnosis not present

## 2016-03-08 DIAGNOSIS — S22081D Stable burst fracture of T11-T12 vertebra, subsequent encounter for fracture with routine healing: Secondary | ICD-10-CM | POA: Diagnosis not present

## 2016-03-08 DIAGNOSIS — Z981 Arthrodesis status: Secondary | ICD-10-CM | POA: Diagnosis not present

## 2016-03-09 DIAGNOSIS — K5900 Constipation, unspecified: Secondary | ICD-10-CM | POA: Diagnosis not present

## 2016-03-22 DIAGNOSIS — K5900 Constipation, unspecified: Secondary | ICD-10-CM | POA: Diagnosis not present

## 2016-03-22 DIAGNOSIS — J019 Acute sinusitis, unspecified: Secondary | ICD-10-CM | POA: Diagnosis not present

## 2016-03-22 DIAGNOSIS — Z6821 Body mass index (BMI) 21.0-21.9, adult: Secondary | ICD-10-CM | POA: Diagnosis not present

## 2016-03-22 DIAGNOSIS — S22000A Wedge compression fracture of unspecified thoracic vertebra, initial encounter for closed fracture: Secondary | ICD-10-CM | POA: Diagnosis not present

## 2016-03-23 DIAGNOSIS — S22000A Wedge compression fracture of unspecified thoracic vertebra, initial encounter for closed fracture: Secondary | ICD-10-CM | POA: Diagnosis not present

## 2016-03-23 DIAGNOSIS — S22070A Wedge compression fracture of T9-T10 vertebra, initial encounter for closed fracture: Secondary | ICD-10-CM | POA: Diagnosis not present

## 2016-03-23 DIAGNOSIS — M8588 Other specified disorders of bone density and structure, other site: Secondary | ICD-10-CM | POA: Diagnosis not present

## 2016-03-23 DIAGNOSIS — Z981 Arthrodesis status: Secondary | ICD-10-CM | POA: Diagnosis not present

## 2016-03-29 DIAGNOSIS — S22070D Wedge compression fracture of T9-T10 vertebra, subsequent encounter for fracture with routine healing: Secondary | ICD-10-CM | POA: Diagnosis not present

## 2016-03-29 DIAGNOSIS — S22080D Wedge compression fracture of T11-T12 vertebra, subsequent encounter for fracture with routine healing: Secondary | ICD-10-CM | POA: Diagnosis not present

## 2016-03-29 DIAGNOSIS — S22081D Stable burst fracture of T11-T12 vertebra, subsequent encounter for fracture with routine healing: Secondary | ICD-10-CM | POA: Diagnosis not present

## 2016-03-29 DIAGNOSIS — J342 Deviated nasal septum: Secondary | ICD-10-CM | POA: Diagnosis not present

## 2016-03-29 DIAGNOSIS — Z981 Arthrodesis status: Secondary | ICD-10-CM | POA: Diagnosis not present

## 2016-03-29 DIAGNOSIS — M4325 Fusion of spine, thoracolumbar region: Secondary | ICD-10-CM | POA: Diagnosis not present

## 2016-04-28 DIAGNOSIS — E039 Hypothyroidism, unspecified: Secondary | ICD-10-CM | POA: Diagnosis not present

## 2016-04-28 DIAGNOSIS — M810 Age-related osteoporosis without current pathological fracture: Secondary | ICD-10-CM | POA: Diagnosis not present

## 2016-05-24 DIAGNOSIS — Z780 Asymptomatic menopausal state: Secondary | ICD-10-CM | POA: Diagnosis not present

## 2016-05-24 DIAGNOSIS — M8589 Other specified disorders of bone density and structure, multiple sites: Secondary | ICD-10-CM | POA: Diagnosis not present

## 2016-05-27 DIAGNOSIS — F329 Major depressive disorder, single episode, unspecified: Secondary | ICD-10-CM | POA: Diagnosis not present

## 2016-05-27 DIAGNOSIS — Z1389 Encounter for screening for other disorder: Secondary | ICD-10-CM | POA: Diagnosis not present

## 2016-05-27 DIAGNOSIS — Z6825 Body mass index (BMI) 25.0-25.9, adult: Secondary | ICD-10-CM | POA: Diagnosis not present

## 2016-05-27 DIAGNOSIS — I10 Essential (primary) hypertension: Secondary | ICD-10-CM | POA: Diagnosis not present

## 2016-05-27 DIAGNOSIS — M069 Rheumatoid arthritis, unspecified: Secondary | ICD-10-CM | POA: Diagnosis not present

## 2016-05-27 DIAGNOSIS — K5900 Constipation, unspecified: Secondary | ICD-10-CM | POA: Diagnosis not present

## 2016-05-27 DIAGNOSIS — J309 Allergic rhinitis, unspecified: Secondary | ICD-10-CM | POA: Diagnosis not present

## 2016-05-27 DIAGNOSIS — S22000A Wedge compression fracture of unspecified thoracic vertebra, initial encounter for closed fracture: Secondary | ICD-10-CM | POA: Diagnosis not present

## 2016-05-27 DIAGNOSIS — J019 Acute sinusitis, unspecified: Secondary | ICD-10-CM | POA: Diagnosis not present

## 2016-05-28 DIAGNOSIS — I10 Essential (primary) hypertension: Secondary | ICD-10-CM | POA: Diagnosis not present

## 2016-06-09 DIAGNOSIS — R05 Cough: Secondary | ICD-10-CM | POA: Diagnosis not present

## 2016-06-09 DIAGNOSIS — Z6825 Body mass index (BMI) 25.0-25.9, adult: Secondary | ICD-10-CM | POA: Diagnosis not present

## 2016-06-14 DIAGNOSIS — M4804 Spinal stenosis, thoracic region: Secondary | ICD-10-CM | POA: Diagnosis not present

## 2016-06-14 DIAGNOSIS — Z981 Arthrodesis status: Secondary | ICD-10-CM | POA: Diagnosis not present

## 2016-06-14 DIAGNOSIS — M546 Pain in thoracic spine: Secondary | ICD-10-CM | POA: Diagnosis not present

## 2016-06-15 ENCOUNTER — Other Ambulatory Visit: Payer: Self-pay | Admitting: Neurosurgery

## 2016-06-15 DIAGNOSIS — M4804 Spinal stenosis, thoracic region: Secondary | ICD-10-CM

## 2016-06-28 ENCOUNTER — Ambulatory Visit
Admission: RE | Admit: 2016-06-28 | Discharge: 2016-06-28 | Disposition: A | Discharge status: Home / Self Care | Payer: Medicare Other | Source: Ambulatory Visit | Attending: Neurosurgery | Admitting: Neurosurgery

## 2016-06-28 DIAGNOSIS — M4804 Spinal stenosis, thoracic region: Secondary | ICD-10-CM

## 2016-06-28 DIAGNOSIS — S22070A Wedge compression fracture of T9-T10 vertebra, initial encounter for closed fracture: Secondary | ICD-10-CM | POA: Diagnosis not present

## 2016-06-28 MED ORDER — GADOBENATE DIMEGLUMINE 529 MG/ML IV SOLN
12.0000 mL | Freq: Once | INTRAVENOUS | Status: AC | PRN
  Administered 2016-06-28: 12 mL via INTRAVENOUS

## 2016-06-29 DIAGNOSIS — Z981 Arthrodesis status: Secondary | ICD-10-CM | POA: Diagnosis not present

## 2016-06-29 DIAGNOSIS — M546 Pain in thoracic spine: Secondary | ICD-10-CM | POA: Diagnosis not present

## 2016-09-13 DIAGNOSIS — E663 Overweight: Secondary | ICD-10-CM | POA: Diagnosis not present

## 2016-09-13 DIAGNOSIS — J019 Acute sinusitis, unspecified: Secondary | ICD-10-CM | POA: Diagnosis not present

## 2016-09-13 DIAGNOSIS — Z6826 Body mass index (BMI) 26.0-26.9, adult: Secondary | ICD-10-CM | POA: Diagnosis not present

## 2016-09-16 DIAGNOSIS — H04123 Dry eye syndrome of bilateral lacrimal glands: Secondary | ICD-10-CM | POA: Diagnosis not present

## 2016-09-16 DIAGNOSIS — H353131 Nonexudative age-related macular degeneration, bilateral, early dry stage: Secondary | ICD-10-CM | POA: Diagnosis not present

## 2016-09-16 DIAGNOSIS — H26492 Other secondary cataract, left eye: Secondary | ICD-10-CM | POA: Diagnosis not present

## 2016-09-16 DIAGNOSIS — H25811 Combined forms of age-related cataract, right eye: Secondary | ICD-10-CM | POA: Diagnosis not present

## 2016-09-27 DIAGNOSIS — F329 Major depressive disorder, single episode, unspecified: Secondary | ICD-10-CM | POA: Diagnosis not present

## 2016-09-27 DIAGNOSIS — E782 Mixed hyperlipidemia: Secondary | ICD-10-CM | POA: Diagnosis not present

## 2016-09-27 DIAGNOSIS — I10 Essential (primary) hypertension: Secondary | ICD-10-CM | POA: Diagnosis not present

## 2016-09-27 DIAGNOSIS — Z6827 Body mass index (BMI) 27.0-27.9, adult: Secondary | ICD-10-CM | POA: Diagnosis not present

## 2016-09-27 DIAGNOSIS — S22000A Wedge compression fracture of unspecified thoracic vertebra, initial encounter for closed fracture: Secondary | ICD-10-CM | POA: Diagnosis not present

## 2016-09-27 DIAGNOSIS — Z23 Encounter for immunization: Secondary | ICD-10-CM | POA: Diagnosis not present

## 2016-09-27 DIAGNOSIS — E039 Hypothyroidism, unspecified: Secondary | ICD-10-CM | POA: Diagnosis not present

## 2016-09-27 DIAGNOSIS — N3281 Overactive bladder: Secondary | ICD-10-CM | POA: Diagnosis not present

## 2016-10-26 DIAGNOSIS — Z9181 History of falling: Secondary | ICD-10-CM | POA: Diagnosis not present

## 2016-10-26 DIAGNOSIS — E039 Hypothyroidism, unspecified: Secondary | ICD-10-CM | POA: Diagnosis not present

## 2016-10-26 DIAGNOSIS — S22000A Wedge compression fracture of unspecified thoracic vertebra, initial encounter for closed fracture: Secondary | ICD-10-CM | POA: Diagnosis not present

## 2016-10-26 DIAGNOSIS — M069 Rheumatoid arthritis, unspecified: Secondary | ICD-10-CM | POA: Diagnosis not present

## 2016-10-26 DIAGNOSIS — Z6827 Body mass index (BMI) 27.0-27.9, adult: Secondary | ICD-10-CM | POA: Diagnosis not present

## 2016-11-08 DIAGNOSIS — M4804 Spinal stenosis, thoracic region: Secondary | ICD-10-CM | POA: Diagnosis not present

## 2016-11-08 DIAGNOSIS — Z981 Arthrodesis status: Secondary | ICD-10-CM | POA: Diagnosis not present

## 2016-11-08 DIAGNOSIS — M546 Pain in thoracic spine: Secondary | ICD-10-CM | POA: Diagnosis not present

## 2016-11-24 DIAGNOSIS — M0609 Rheumatoid arthritis without rheumatoid factor, multiple sites: Secondary | ICD-10-CM | POA: Diagnosis not present

## 2016-11-24 DIAGNOSIS — M25562 Pain in left knee: Secondary | ICD-10-CM | POA: Diagnosis not present

## 2016-11-24 DIAGNOSIS — M25561 Pain in right knee: Secondary | ICD-10-CM | POA: Diagnosis not present

## 2016-11-24 DIAGNOSIS — Z6825 Body mass index (BMI) 25.0-25.9, adult: Secondary | ICD-10-CM | POA: Diagnosis not present

## 2016-11-24 DIAGNOSIS — E663 Overweight: Secondary | ICD-10-CM | POA: Diagnosis not present

## 2016-11-29 DIAGNOSIS — S299XXA Unspecified injury of thorax, initial encounter: Secondary | ICD-10-CM | POA: Diagnosis not present

## 2016-11-29 DIAGNOSIS — Z981 Arthrodesis status: Secondary | ICD-10-CM | POA: Diagnosis not present

## 2016-11-29 DIAGNOSIS — I714 Abdominal aortic aneurysm, without rupture: Secondary | ICD-10-CM | POA: Diagnosis not present

## 2016-12-07 DIAGNOSIS — M4804 Spinal stenosis, thoracic region: Secondary | ICD-10-CM | POA: Diagnosis not present

## 2016-12-07 DIAGNOSIS — Z981 Arthrodesis status: Secondary | ICD-10-CM | POA: Diagnosis not present

## 2016-12-07 DIAGNOSIS — M546 Pain in thoracic spine: Secondary | ICD-10-CM | POA: Diagnosis not present

## 2016-12-10 DIAGNOSIS — Z6826 Body mass index (BMI) 26.0-26.9, adult: Secondary | ICD-10-CM | POA: Diagnosis not present

## 2016-12-10 DIAGNOSIS — E663 Overweight: Secondary | ICD-10-CM | POA: Diagnosis not present

## 2016-12-10 DIAGNOSIS — R1084 Generalized abdominal pain: Secondary | ICD-10-CM | POA: Diagnosis not present

## 2016-12-13 DIAGNOSIS — I714 Abdominal aortic aneurysm, without rupture: Secondary | ICD-10-CM | POA: Diagnosis not present

## 2016-12-13 DIAGNOSIS — R1084 Generalized abdominal pain: Secondary | ICD-10-CM | POA: Diagnosis not present

## 2016-12-14 DIAGNOSIS — M545 Low back pain: Secondary | ICD-10-CM | POA: Diagnosis not present

## 2016-12-15 DIAGNOSIS — S22081A Stable burst fracture of T11-T12 vertebra, initial encounter for closed fracture: Secondary | ICD-10-CM | POA: Diagnosis not present

## 2016-12-20 DIAGNOSIS — X58XXXA Exposure to other specified factors, initial encounter: Secondary | ICD-10-CM | POA: Diagnosis not present

## 2016-12-20 DIAGNOSIS — S22070A Wedge compression fracture of T9-T10 vertebra, initial encounter for closed fracture: Secondary | ICD-10-CM | POA: Diagnosis not present

## 2016-12-20 DIAGNOSIS — S22081A Stable burst fracture of T11-T12 vertebra, initial encounter for closed fracture: Secondary | ICD-10-CM | POA: Diagnosis not present

## 2016-12-22 DIAGNOSIS — S22000A Wedge compression fracture of unspecified thoracic vertebra, initial encounter for closed fracture: Secondary | ICD-10-CM | POA: Diagnosis not present

## 2016-12-24 DIAGNOSIS — E039 Hypothyroidism, unspecified: Secondary | ICD-10-CM | POA: Diagnosis not present

## 2017-01-17 DIAGNOSIS — M25562 Pain in left knee: Secondary | ICD-10-CM | POA: Diagnosis not present

## 2017-01-17 DIAGNOSIS — M0609 Rheumatoid arthritis without rheumatoid factor, multiple sites: Secondary | ICD-10-CM | POA: Diagnosis not present

## 2017-01-17 DIAGNOSIS — E663 Overweight: Secondary | ICD-10-CM | POA: Diagnosis not present

## 2017-01-17 DIAGNOSIS — Z6825 Body mass index (BMI) 25.0-25.9, adult: Secondary | ICD-10-CM | POA: Diagnosis not present

## 2017-01-17 DIAGNOSIS — M25561 Pain in right knee: Secondary | ICD-10-CM | POA: Diagnosis not present

## 2017-01-21 DIAGNOSIS — M546 Pain in thoracic spine: Secondary | ICD-10-CM | POA: Diagnosis not present

## 2017-01-23 DIAGNOSIS — M549 Dorsalgia, unspecified: Secondary | ICD-10-CM | POA: Diagnosis not present

## 2017-01-23 DIAGNOSIS — S22070A Wedge compression fracture of T9-T10 vertebra, initial encounter for closed fracture: Secondary | ICD-10-CM | POA: Diagnosis not present

## 2017-01-23 DIAGNOSIS — I714 Abdominal aortic aneurysm, without rupture: Secondary | ICD-10-CM | POA: Diagnosis not present

## 2017-01-23 DIAGNOSIS — G8929 Other chronic pain: Secondary | ICD-10-CM | POA: Diagnosis not present

## 2017-01-23 DIAGNOSIS — M419 Scoliosis, unspecified: Secondary | ICD-10-CM | POA: Diagnosis not present

## 2017-01-25 DIAGNOSIS — S22080A Wedge compression fracture of T11-T12 vertebra, initial encounter for closed fracture: Secondary | ICD-10-CM | POA: Diagnosis not present

## 2017-01-31 DIAGNOSIS — T84226A Displacement of internal fixation device of vertebrae, initial encounter: Secondary | ICD-10-CM | POA: Diagnosis not present

## 2017-01-31 DIAGNOSIS — Z7952 Long term (current) use of systemic steroids: Secondary | ICD-10-CM | POA: Diagnosis not present

## 2017-01-31 DIAGNOSIS — E039 Hypothyroidism, unspecified: Secondary | ICD-10-CM | POA: Diagnosis not present

## 2017-01-31 DIAGNOSIS — S32010A Wedge compression fracture of first lumbar vertebra, initial encounter for closed fracture: Secondary | ICD-10-CM | POA: Diagnosis not present

## 2017-01-31 DIAGNOSIS — S22070G Wedge compression fracture of T9-T10 vertebra, subsequent encounter for fracture with delayed healing: Secondary | ICD-10-CM | POA: Diagnosis not present

## 2017-01-31 DIAGNOSIS — S22070A Wedge compression fracture of T9-T10 vertebra, initial encounter for closed fracture: Secondary | ICD-10-CM | POA: Diagnosis not present

## 2017-01-31 DIAGNOSIS — Z79899 Other long term (current) drug therapy: Secondary | ICD-10-CM | POA: Diagnosis not present

## 2017-01-31 DIAGNOSIS — J449 Chronic obstructive pulmonary disease, unspecified: Secondary | ICD-10-CM | POA: Diagnosis not present

## 2017-01-31 DIAGNOSIS — Z981 Arthrodesis status: Secondary | ICD-10-CM | POA: Diagnosis not present

## 2017-01-31 DIAGNOSIS — K219 Gastro-esophageal reflux disease without esophagitis: Secondary | ICD-10-CM | POA: Diagnosis not present

## 2017-01-31 DIAGNOSIS — T8484XA Pain due to internal orthopedic prosthetic devices, implants and grafts, initial encounter: Secondary | ICD-10-CM | POA: Diagnosis not present

## 2017-01-31 DIAGNOSIS — K5900 Constipation, unspecified: Secondary | ICD-10-CM | POA: Diagnosis not present

## 2017-01-31 DIAGNOSIS — T84296A Other mechanical complication of internal fixation device of vertebrae, initial encounter: Secondary | ICD-10-CM | POA: Diagnosis not present

## 2017-01-31 DIAGNOSIS — J309 Allergic rhinitis, unspecified: Secondary | ICD-10-CM | POA: Diagnosis not present

## 2017-01-31 DIAGNOSIS — I10 Essential (primary) hypertension: Secondary | ICD-10-CM | POA: Diagnosis not present

## 2017-01-31 DIAGNOSIS — S22080A Wedge compression fracture of T11-T12 vertebra, initial encounter for closed fracture: Secondary | ICD-10-CM | POA: Diagnosis not present

## 2017-01-31 DIAGNOSIS — Z87891 Personal history of nicotine dependence: Secondary | ICD-10-CM | POA: Diagnosis not present

## 2017-02-09 DIAGNOSIS — H1010 Acute atopic conjunctivitis, unspecified eye: Secondary | ICD-10-CM

## 2017-02-09 DIAGNOSIS — I714 Abdominal aortic aneurysm, without rupture: Secondary | ICD-10-CM | POA: Diagnosis not present

## 2017-02-09 DIAGNOSIS — E2839 Other primary ovarian failure: Secondary | ICD-10-CM | POA: Diagnosis not present

## 2017-02-09 DIAGNOSIS — S22000A Wedge compression fracture of unspecified thoracic vertebra, initial encounter for closed fracture: Secondary | ICD-10-CM | POA: Diagnosis not present

## 2017-02-09 DIAGNOSIS — J309 Allergic rhinitis, unspecified: Secondary | ICD-10-CM

## 2017-02-09 DIAGNOSIS — Z6827 Body mass index (BMI) 27.0-27.9, adult: Secondary | ICD-10-CM | POA: Diagnosis not present

## 2017-02-09 DIAGNOSIS — M069 Rheumatoid arthritis, unspecified: Secondary | ICD-10-CM

## 2017-02-09 DIAGNOSIS — J019 Acute sinusitis, unspecified: Secondary | ICD-10-CM | POA: Diagnosis not present

## 2017-02-09 DIAGNOSIS — L219 Seborrheic dermatitis, unspecified: Secondary | ICD-10-CM

## 2017-02-09 HISTORY — DX: Acute atopic conjunctivitis, unspecified eye: H10.10

## 2017-02-09 HISTORY — DX: Rheumatoid arthritis, unspecified: M06.9

## 2017-02-09 HISTORY — DX: Allergic rhinitis, unspecified: J30.9

## 2017-02-09 HISTORY — DX: Seborrheic dermatitis, unspecified: L21.9

## 2017-02-25 DIAGNOSIS — J019 Acute sinusitis, unspecified: Secondary | ICD-10-CM | POA: Diagnosis not present

## 2017-02-25 DIAGNOSIS — I714 Abdominal aortic aneurysm, without rupture: Secondary | ICD-10-CM | POA: Diagnosis not present

## 2017-02-25 DIAGNOSIS — E039 Hypothyroidism, unspecified: Secondary | ICD-10-CM | POA: Diagnosis not present

## 2017-02-25 DIAGNOSIS — F329 Major depressive disorder, single episode, unspecified: Secondary | ICD-10-CM | POA: Diagnosis not present

## 2017-02-25 DIAGNOSIS — H6090 Unspecified otitis externa, unspecified ear: Secondary | ICD-10-CM | POA: Diagnosis not present

## 2017-02-25 DIAGNOSIS — S22000A Wedge compression fracture of unspecified thoracic vertebra, initial encounter for closed fracture: Secondary | ICD-10-CM | POA: Diagnosis not present

## 2017-02-25 DIAGNOSIS — I10 Essential (primary) hypertension: Secondary | ICD-10-CM | POA: Diagnosis not present

## 2017-02-25 DIAGNOSIS — Z6827 Body mass index (BMI) 27.0-27.9, adult: Secondary | ICD-10-CM | POA: Diagnosis not present

## 2017-02-28 ENCOUNTER — Telehealth (HOSPITAL_COMMUNITY): Payer: Self-pay | Admitting: Surgery

## 2017-02-28 NOTE — Telephone Encounter (Signed)
Mrs. Joanne Whitehead returned the calls to schedule an appointment requested by Joanne Whitehead.  Joanne Whitehead is established with Dr. Trula Whitehead which at her last exam requested a CTA, carotid ultrasound, LE arterial duplex, and ABI's.  Dr. Trula Whitehead confirmed by staff message he would need all of these prior to seeing Joanne Whitehead again.     Joanne Whitehead said her daughter was off work this Wednesday, 05/16 and she would need to come in on this date.  I told her we could not get all of the test scheduled before this date.  She said she knows nothing about [place]. She said she may call back when her daughter is off or she guess she would just drop dead and hung up the phone on me.

## 2017-02-28 NOTE — Telephone Encounter (Signed)
-----   Message from Serafina Mitchell, MD sent at 02/16/2017  9:11 PM EDT ----- Regarding: RE: Appointment scheduling question Yes, I would like all of those studies re-ordered.  Thanks ----- Message ----- From: Rufina Falco Sent: 02/16/2017  12:50 PM To: Serafina Mitchell, MD Subject: Appointment scheduling question                You saw Mrs. Dorame 08/2015 for AAA.  You requested she return in 6 months for a CTA, carotid, abi's, and LE arterial.  She cancelled the appointments.  The medical doctor is referring her back for AAA.   So my question is do you want all of the above schedule before you see her again?  Rip Harbour

## 2017-03-03 ENCOUNTER — Other Ambulatory Visit: Payer: Self-pay

## 2017-03-03 DIAGNOSIS — I714 Abdominal aortic aneurysm, without rupture, unspecified: Secondary | ICD-10-CM

## 2017-03-04 ENCOUNTER — Other Ambulatory Visit: Payer: Self-pay

## 2017-03-07 ENCOUNTER — Other Ambulatory Visit: Payer: Self-pay

## 2017-03-07 DIAGNOSIS — I714 Abdominal aortic aneurysm, without rupture, unspecified: Secondary | ICD-10-CM

## 2017-03-07 DIAGNOSIS — I739 Peripheral vascular disease, unspecified: Secondary | ICD-10-CM

## 2017-03-07 DIAGNOSIS — I6523 Occlusion and stenosis of bilateral carotid arteries: Secondary | ICD-10-CM

## 2017-03-15 ENCOUNTER — Encounter: Payer: Self-pay | Admitting: Surgery

## 2017-03-15 ENCOUNTER — Other Ambulatory Visit: Payer: Self-pay | Admitting: Surgery

## 2017-03-15 DIAGNOSIS — I714 Abdominal aortic aneurysm, without rupture, unspecified: Secondary | ICD-10-CM

## 2017-03-17 DIAGNOSIS — H25811 Combined forms of age-related cataract, right eye: Secondary | ICD-10-CM | POA: Diagnosis not present

## 2017-03-17 DIAGNOSIS — H353131 Nonexudative age-related macular degeneration, bilateral, early dry stage: Secondary | ICD-10-CM | POA: Diagnosis not present

## 2017-03-17 DIAGNOSIS — H26492 Other secondary cataract, left eye: Secondary | ICD-10-CM | POA: Diagnosis not present

## 2017-04-12 DIAGNOSIS — Z6826 Body mass index (BMI) 26.0-26.9, adult: Secondary | ICD-10-CM | POA: Diagnosis not present

## 2017-04-12 DIAGNOSIS — J329 Chronic sinusitis, unspecified: Secondary | ICD-10-CM | POA: Diagnosis not present

## 2017-05-06 ENCOUNTER — Encounter: Payer: Self-pay | Admitting: Surgery

## 2017-05-16 ENCOUNTER — Encounter (HOSPITAL_COMMUNITY): Payer: Medicare Other

## 2017-05-16 ENCOUNTER — Ambulatory Visit: Payer: Medicare Other | Admitting: Surgery

## 2017-05-16 ENCOUNTER — Other Ambulatory Visit: Payer: Medicare Other

## 2017-06-06 DIAGNOSIS — Z6824 Body mass index (BMI) 24.0-24.9, adult: Secondary | ICD-10-CM | POA: Diagnosis not present

## 2017-06-06 DIAGNOSIS — G44209 Tension-type headache, unspecified, not intractable: Secondary | ICD-10-CM | POA: Diagnosis not present

## 2017-07-04 ENCOUNTER — Ambulatory Visit (INDEPENDENT_AMBULATORY_CARE_PROVIDER_SITE_OTHER)
Admission: RE | Admit: 2017-07-04 | Discharge: 2017-07-04 | Disposition: A | Discharge status: Home / Self Care | Payer: Medicare Other | Source: Ambulatory Visit | Attending: Surgery | Admitting: Surgery

## 2017-07-04 ENCOUNTER — Ambulatory Visit
Admission: RE | Admit: 2017-07-04 | Discharge: 2017-07-04 | Disposition: A | Discharge status: Home / Self Care | Payer: Medicare Other | Source: Ambulatory Visit | Attending: Surgery | Admitting: Surgery

## 2017-07-04 ENCOUNTER — Ambulatory Visit: Admission: RE | Admit: 2017-07-04 | Payer: Medicare Other | Source: Ambulatory Visit

## 2017-07-04 ENCOUNTER — Ambulatory Visit: Payer: Medicare Other | Admitting: Surgery

## 2017-07-04 ENCOUNTER — Ambulatory Visit (HOSPITAL_COMMUNITY)
Admission: RE | Admit: 2017-07-04 | Discharge: 2017-07-04 | Disposition: A | Discharge status: Home / Self Care | Payer: Medicare Other | Source: Ambulatory Visit | Attending: Surgery | Admitting: Surgery

## 2017-07-04 DIAGNOSIS — I6523 Occlusion and stenosis of bilateral carotid arteries: Secondary | ICD-10-CM | POA: Insufficient documentation

## 2017-07-04 DIAGNOSIS — I714 Abdominal aortic aneurysm, without rupture, unspecified: Secondary | ICD-10-CM

## 2017-07-04 DIAGNOSIS — I739 Peripheral vascular disease, unspecified: Secondary | ICD-10-CM

## 2017-07-04 LAB — VAS US CAROTID
LEFT ECA DIAS: -19 cm/s
LEFT VERTEBRAL DIAS: 14 cm/s
Left CCA dist dias: 29 cm/s
Left CCA dist sys: 84 cm/s
Left CCA prox dias: -25 cm/s
Left CCA prox sys: -92 cm/s
Left ICA dist dias: -29 cm/s
Left ICA dist sys: -83 cm/s
Left ICA prox dias: 31 cm/s
Left ICA prox sys: 81 cm/s
RIGHT CCA MID DIAS: 17 cm/s
RIGHT ECA DIAS: 9 cm/s
RIGHT VERTEBRAL DIAS: -23 cm/s
Right CCA prox dias: 17 cm/s
Right CCA prox sys: 77 cm/s
Right cca dist sys: -66 cm/s

## 2017-07-04 MED ORDER — IOPAMIDOL (ISOVUE-370) INJECTION 76%
75.0000 mL | Freq: Once | INTRAVENOUS | Status: AC | PRN
  Administered 2017-07-04: 75 mL via INTRAVENOUS

## 2017-07-08 DIAGNOSIS — I714 Abdominal aortic aneurysm, without rupture: Secondary | ICD-10-CM | POA: Diagnosis not present

## 2017-07-08 DIAGNOSIS — M546 Pain in thoracic spine: Secondary | ICD-10-CM | POA: Diagnosis not present

## 2017-07-08 DIAGNOSIS — Z79899 Other long term (current) drug therapy: Secondary | ICD-10-CM | POA: Diagnosis not present

## 2017-07-08 DIAGNOSIS — F329 Major depressive disorder, single episode, unspecified: Secondary | ICD-10-CM | POA: Diagnosis not present

## 2017-07-08 DIAGNOSIS — E039 Hypothyroidism, unspecified: Secondary | ICD-10-CM | POA: Diagnosis not present

## 2017-07-08 DIAGNOSIS — I10 Essential (primary) hypertension: Secondary | ICD-10-CM | POA: Diagnosis not present

## 2017-07-08 DIAGNOSIS — M069 Rheumatoid arthritis, unspecified: Secondary | ICD-10-CM | POA: Diagnosis not present

## 2017-07-08 DIAGNOSIS — Z6824 Body mass index (BMI) 24.0-24.9, adult: Secondary | ICD-10-CM | POA: Diagnosis not present

## 2017-07-18 DIAGNOSIS — M0609 Rheumatoid arthritis without rheumatoid factor, multiple sites: Secondary | ICD-10-CM | POA: Diagnosis not present

## 2017-07-18 DIAGNOSIS — Z6820 Body mass index (BMI) 20.0-20.9, adult: Secondary | ICD-10-CM | POA: Diagnosis not present

## 2017-07-28 DIAGNOSIS — S22000A Wedge compression fracture of unspecified thoracic vertebra, initial encounter for closed fracture: Secondary | ICD-10-CM | POA: Diagnosis not present

## 2017-07-29 DIAGNOSIS — S2221XA Fracture of manubrium, initial encounter for closed fracture: Secondary | ICD-10-CM | POA: Diagnosis not present

## 2017-07-29 DIAGNOSIS — I714 Abdominal aortic aneurysm, without rupture: Secondary | ICD-10-CM | POA: Diagnosis not present

## 2017-07-29 DIAGNOSIS — S299XXA Unspecified injury of thorax, initial encounter: Secondary | ICD-10-CM | POA: Diagnosis not present

## 2017-07-29 DIAGNOSIS — S0990XA Unspecified injury of head, initial encounter: Secondary | ICD-10-CM | POA: Diagnosis not present

## 2017-07-29 DIAGNOSIS — R410 Disorientation, unspecified: Secondary | ICD-10-CM | POA: Diagnosis not present

## 2017-07-29 DIAGNOSIS — S279XXA Injury of unspecified intrathoracic organ, initial encounter: Secondary | ICD-10-CM | POA: Diagnosis not present

## 2017-07-29 DIAGNOSIS — S3993XA Unspecified injury of pelvis, initial encounter: Secondary | ICD-10-CM | POA: Diagnosis not present

## 2017-07-30 DIAGNOSIS — S199XXA Unspecified injury of neck, initial encounter: Secondary | ICD-10-CM | POA: Diagnosis not present

## 2017-07-30 DIAGNOSIS — R2689 Other abnormalities of gait and mobility: Secondary | ICD-10-CM | POA: Diagnosis not present

## 2017-07-30 DIAGNOSIS — E039 Hypothyroidism, unspecified: Secondary | ICD-10-CM | POA: Diagnosis not present

## 2017-07-30 DIAGNOSIS — Y92410 Unspecified street and highway as the place of occurrence of the external cause: Secondary | ICD-10-CM | POA: Diagnosis not present

## 2017-07-30 DIAGNOSIS — S2221XA Fracture of manubrium, initial encounter for closed fracture: Secondary | ICD-10-CM | POA: Diagnosis not present

## 2017-07-30 DIAGNOSIS — R0789 Other chest pain: Secondary | ICD-10-CM | POA: Diagnosis not present

## 2017-07-30 DIAGNOSIS — S3991XA Unspecified injury of abdomen, initial encounter: Secondary | ICD-10-CM | POA: Diagnosis not present

## 2017-07-30 DIAGNOSIS — I10 Essential (primary) hypertension: Secondary | ICD-10-CM | POA: Diagnosis not present

## 2017-07-30 DIAGNOSIS — R0689 Other abnormalities of breathing: Secondary | ICD-10-CM | POA: Diagnosis not present

## 2017-07-30 DIAGNOSIS — Z041 Encounter for examination and observation following transport accident: Secondary | ICD-10-CM | POA: Diagnosis not present

## 2017-07-30 DIAGNOSIS — S2220XA Unspecified fracture of sternum, initial encounter for closed fracture: Secondary | ICD-10-CM | POA: Diagnosis not present

## 2017-07-30 DIAGNOSIS — Y998 Other external cause status: Secondary | ICD-10-CM | POA: Diagnosis not present

## 2017-07-30 DIAGNOSIS — Z9889 Other specified postprocedural states: Secondary | ICD-10-CM | POA: Diagnosis not present

## 2017-07-30 DIAGNOSIS — E785 Hyperlipidemia, unspecified: Secondary | ICD-10-CM | POA: Diagnosis not present

## 2017-07-30 DIAGNOSIS — S0990XA Unspecified injury of head, initial encounter: Secondary | ICD-10-CM | POA: Diagnosis not present

## 2017-07-30 DIAGNOSIS — K219 Gastro-esophageal reflux disease without esophagitis: Secondary | ICD-10-CM | POA: Diagnosis not present

## 2017-07-31 DIAGNOSIS — R0689 Other abnormalities of breathing: Secondary | ICD-10-CM | POA: Diagnosis not present

## 2017-07-31 DIAGNOSIS — S2221XA Fracture of manubrium, initial encounter for closed fracture: Secondary | ICD-10-CM | POA: Diagnosis not present

## 2017-08-01 DIAGNOSIS — R0689 Other abnormalities of breathing: Secondary | ICD-10-CM | POA: Diagnosis not present

## 2017-08-01 DIAGNOSIS — Z608 Other problems related to social environment: Secondary | ICD-10-CM | POA: Diagnosis not present

## 2017-08-01 DIAGNOSIS — S2221XA Fracture of manubrium, initial encounter for closed fracture: Secondary | ICD-10-CM | POA: Diagnosis not present

## 2017-08-02 DIAGNOSIS — I714 Abdominal aortic aneurysm, without rupture: Secondary | ICD-10-CM | POA: Diagnosis not present

## 2017-08-02 DIAGNOSIS — S2221XA Fracture of manubrium, initial encounter for closed fracture: Secondary | ICD-10-CM | POA: Diagnosis not present

## 2017-08-08 ENCOUNTER — Ambulatory Visit (INDEPENDENT_AMBULATORY_CARE_PROVIDER_SITE_OTHER): Payer: Medicare Other | Admitting: Surgery

## 2017-08-08 ENCOUNTER — Encounter: Payer: Self-pay | Admitting: Surgery

## 2017-08-08 VITALS — BP 138/95 | HR 98 | Temp 97.0°F | Resp 18 | Ht 64.0 in | Wt 116.0 lb

## 2017-08-08 DIAGNOSIS — I714 Abdominal aortic aneurysm, without rupture, unspecified: Secondary | ICD-10-CM

## 2017-08-08 DIAGNOSIS — I6523 Occlusion and stenosis of bilateral carotid arteries: Secondary | ICD-10-CM
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
## 2017-08-22 ENCOUNTER — Encounter: Payer: Self-pay | Admitting: Surgery

## 2017-08-22 DIAGNOSIS — K5909 Other constipation: Secondary | ICD-10-CM | POA: Diagnosis not present

## 2017-08-22 DIAGNOSIS — K644 Residual hemorrhoidal skin tags: Secondary | ICD-10-CM | POA: Diagnosis not present

## 2017-08-25 DIAGNOSIS — K6289 Other specified diseases of anus and rectum: Secondary | ICD-10-CM | POA: Diagnosis not present

## 2017-08-25 DIAGNOSIS — K5900 Constipation, unspecified: Secondary | ICD-10-CM | POA: Diagnosis not present

## 2017-08-25 DIAGNOSIS — K625 Hemorrhage of anus and rectum: Secondary | ICD-10-CM | POA: Diagnosis not present

## 2017-08-26 DIAGNOSIS — K625 Hemorrhage of anus and rectum: Secondary | ICD-10-CM | POA: Diagnosis not present

## 2017-09-01 DIAGNOSIS — K5900 Constipation, unspecified: Secondary | ICD-10-CM | POA: Diagnosis not present

## 2017-09-01 DIAGNOSIS — K625 Hemorrhage of anus and rectum: Secondary | ICD-10-CM | POA: Diagnosis not present

## 2017-09-05 DIAGNOSIS — Z6821 Body mass index (BMI) 21.0-21.9, adult: Secondary | ICD-10-CM | POA: Diagnosis not present

## 2017-09-05 DIAGNOSIS — R1310 Dysphagia, unspecified: Secondary | ICD-10-CM | POA: Diagnosis not present

## 2017-09-05 DIAGNOSIS — J019 Acute sinusitis, unspecified: Secondary | ICD-10-CM | POA: Diagnosis not present

## 2017-09-07 ENCOUNTER — Telehealth: Payer: Self-pay | Admitting: *Deleted

## 2017-09-07 NOTE — Telephone Encounter (Signed)
Patient called and asked "when am I suppose to come in and have that aneurysm drained ?" Informed her that I had asked Dr. Trula Slade  On 09/05/17 if ready to schedule surgery but not ready to schedule surgery yet.   Explained to patient and reviewed last office visit note that doctor waiting device to use for this surgery and plan was to schedule in the next 1-2 months. "What am I suppose to do, just wait around?"  Told her doctor would be in the office on Monday and I would get another update on anything additional I could tell her.  Repeated the above and instructed her to go to ED for any acute distress. She stated she" has went from doctor to doctor and was not going to any emergency room" and hung up on me.

## 2017-09-19 DIAGNOSIS — E663 Overweight: Secondary | ICD-10-CM | POA: Diagnosis not present

## 2017-09-19 DIAGNOSIS — M0609 Rheumatoid arthritis without rheumatoid factor, multiple sites: Secondary | ICD-10-CM | POA: Diagnosis not present

## 2017-09-19 DIAGNOSIS — Z681 Body mass index (BMI) 19 or less, adult: Secondary | ICD-10-CM | POA: Diagnosis not present

## 2017-09-19 DIAGNOSIS — M25561 Pain in right knee: Secondary | ICD-10-CM | POA: Diagnosis not present

## 2017-09-19 DIAGNOSIS — M25562 Pain in left knee: Secondary | ICD-10-CM | POA: Diagnosis not present

## 2017-09-29 DIAGNOSIS — R05 Cough: Secondary | ICD-10-CM | POA: Diagnosis not present

## 2017-09-29 DIAGNOSIS — J209 Acute bronchitis, unspecified: Secondary | ICD-10-CM | POA: Diagnosis not present

## 2017-09-29 DIAGNOSIS — E039 Hypothyroidism, unspecified: Secondary | ICD-10-CM | POA: Diagnosis not present

## 2017-09-29 DIAGNOSIS — Z6820 Body mass index (BMI) 20.0-20.9, adult: Secondary | ICD-10-CM | POA: Diagnosis not present

## 2017-09-29 DIAGNOSIS — R634 Abnormal weight loss: Secondary | ICD-10-CM | POA: Diagnosis not present

## 2017-10-04 DIAGNOSIS — S22000A Wedge compression fracture of unspecified thoracic vertebra, initial encounter for closed fracture: Secondary | ICD-10-CM | POA: Diagnosis not present

## 2017-10-06 ENCOUNTER — Telehealth: Payer: Self-pay | Admitting: *Deleted

## 2017-10-06 DIAGNOSIS — K0889 Other specified disorders of teeth and supporting structures: Secondary | ICD-10-CM | POA: Diagnosis not present

## 2017-10-06 DIAGNOSIS — Z6822 Body mass index (BMI) 22.0-22.9, adult: Secondary | ICD-10-CM | POA: Diagnosis not present

## 2017-10-06 DIAGNOSIS — K1379 Other lesions of oral mucosa: Secondary | ICD-10-CM | POA: Diagnosis not present

## 2017-10-06 NOTE — Telephone Encounter (Signed)
Left message on cell and home phone for patient to call me back for update on pending surgery and device availability date. As requested by Dr. Trula Slade.

## 2017-10-07 ENCOUNTER — Telehealth: Payer: Self-pay | Admitting: *Deleted

## 2017-10-07 NOTE — Telephone Encounter (Signed)
Spoke with patient this am as requested by Dr. Trula Slade re: EVAR and waiting for device possibly ready  in January.

## 2017-10-21 DIAGNOSIS — M5124 Other intervertebral disc displacement, thoracic region: Secondary | ICD-10-CM | POA: Diagnosis not present

## 2017-10-21 DIAGNOSIS — M4854XA Collapsed vertebra, not elsewhere classified, thoracic region, initial encounter for fracture: Secondary | ICD-10-CM | POA: Diagnosis not present

## 2017-10-21 DIAGNOSIS — Z981 Arthrodesis status: Secondary | ICD-10-CM | POA: Diagnosis not present

## 2017-10-25 DIAGNOSIS — M549 Dorsalgia, unspecified: Secondary | ICD-10-CM | POA: Diagnosis not present

## 2017-10-25 DIAGNOSIS — S22000A Wedge compression fracture of unspecified thoracic vertebra, initial encounter for closed fracture: Secondary | ICD-10-CM | POA: Diagnosis not present

## 2017-10-27 DIAGNOSIS — B0052 Herpesviral keratitis: Secondary | ICD-10-CM | POA: Diagnosis not present

## 2017-10-31 ENCOUNTER — Telehealth: Payer: Self-pay | Admitting: *Deleted

## 2017-10-31 DIAGNOSIS — N952 Postmenopausal atrophic vaginitis: Secondary | ICD-10-CM | POA: Diagnosis not present

## 2017-10-31 DIAGNOSIS — R358 Other polyuria: Secondary | ICD-10-CM | POA: Diagnosis not present

## 2017-10-31 DIAGNOSIS — Z23 Encounter for immunization: Secondary | ICD-10-CM | POA: Diagnosis not present

## 2017-10-31 DIAGNOSIS — R634 Abnormal weight loss: Secondary | ICD-10-CM | POA: Diagnosis not present

## 2017-10-31 DIAGNOSIS — Z0000 Encounter for general adult medical examination without abnormal findings: Secondary | ICD-10-CM | POA: Diagnosis not present

## 2017-10-31 DIAGNOSIS — Z6822 Body mass index (BMI) 22.0-22.9, adult: Secondary | ICD-10-CM | POA: Diagnosis not present

## 2017-10-31 NOTE — Telephone Encounter (Signed)
Per Dr. Trula Slade ready to schedule for EVAR. Left message for patient to call Becky back to move forward with scheduling surgery.

## 2017-11-01 ENCOUNTER — Other Ambulatory Visit: Payer: Self-pay | Admitting: *Deleted

## 2017-11-01 ENCOUNTER — Telehealth: Payer: Self-pay | Admitting: *Deleted

## 2017-11-01 ENCOUNTER — Encounter: Payer: Self-pay | Admitting: Surgery

## 2017-11-01 NOTE — Telephone Encounter (Signed)
Patient returned call and stated she wanted to follow up with her "back doctor" and then will call me back to let me know if she desires  to proceed with scheduling surgery.

## 2017-11-01 NOTE — Progress Notes (Signed)
Patient called and notified to be at [facility] at 9:15 am on 11/23/17 for EVAR. NPO past MN night prior and to follow the detailed instructions from the [facility] about this surgery. Verbalized understanding.

## 2017-11-02 ENCOUNTER — Telehealth: Payer: Self-pay | Admitting: Surgery

## 2017-11-02 NOTE — Telephone Encounter (Signed)
Spoke with patient about surgery.  I told her she is not a candidate for the comformable device, but should get a good repair with the standard device.  She is willing to proceed.  Annamarie Major

## 2017-11-03 DIAGNOSIS — B0052 Herpesviral keratitis: Secondary | ICD-10-CM | POA: Diagnosis not present

## 2017-11-04 DIAGNOSIS — R1310 Dysphagia, unspecified: Secondary | ICD-10-CM | POA: Diagnosis not present

## 2017-11-04 DIAGNOSIS — Z6823 Body mass index (BMI) 23.0-23.9, adult: Secondary | ICD-10-CM | POA: Diagnosis not present

## 2017-11-04 DIAGNOSIS — Z9181 History of falling: Secondary | ICD-10-CM | POA: Diagnosis not present

## 2017-11-04 DIAGNOSIS — L299 Pruritus, unspecified: Secondary | ICD-10-CM | POA: Diagnosis not present

## 2017-11-04 DIAGNOSIS — M546 Pain in thoracic spine: Secondary | ICD-10-CM | POA: Diagnosis not present

## 2017-11-04 DIAGNOSIS — S22000A Wedge compression fracture of unspecified thoracic vertebra, initial encounter for closed fracture: Secondary | ICD-10-CM | POA: Diagnosis not present

## 2017-11-15 DIAGNOSIS — S22000A Wedge compression fracture of unspecified thoracic vertebra, initial encounter for closed fracture: Secondary | ICD-10-CM | POA: Diagnosis not present

## 2017-11-22 ENCOUNTER — Encounter (HOSPITAL_COMMUNITY): Payer: Self-pay | Admitting: *Deleted

## 2017-11-22 NOTE — Progress Notes (Signed)
Attempted pre-op call with pt. Pt got upset when I asked her to call her rhematologist and let him know she was having surgery and what to do about her [place].  Pt hung up on me while we were going over her medications (she had refused to give list to pharmacy tech when they called). I called her back and told her I wasn't finished with the call and she "well hurry up, I don't have time for this". She refused to let me go over her history. I gave her instructions as best as I could, she kept interrupting me and finally got mad when I told her she could wear her dentures into surgery. She said she's had enough and hung up. I notified Becky, RN at Dr. Stephens Shire office that pt refused to get complete instructions.

## 2017-11-23 ENCOUNTER — Inpatient Hospital Stay (HOSPITAL_COMMUNITY): Payer: Medicare Other | Admitting: Anesthesiology

## 2017-11-23 ENCOUNTER — Encounter (HOSPITAL_COMMUNITY): Payer: Self-pay | Admitting: *Deleted

## 2017-11-23 ENCOUNTER — Inpatient Hospital Stay (HOSPITAL_COMMUNITY)
Admission: RE | Admit: 2017-11-23 | Discharge: 2017-11-24 | DRG: 269 | Disposition: A | Discharge status: Home Health | Payer: Medicare Other | Source: Ambulatory Visit | Attending: Surgery | Admitting: Surgery

## 2017-11-23 ENCOUNTER — Encounter (HOSPITAL_COMMUNITY)
Admission: RE | Disposition: A | Discharge status: Home Health | Payer: Self-pay | Source: Ambulatory Visit | Attending: Surgery

## 2017-11-23 ENCOUNTER — Inpatient Hospital Stay (HOSPITAL_COMMUNITY): Payer: Medicare Other

## 2017-11-23 ENCOUNTER — Other Ambulatory Visit: Payer: Self-pay

## 2017-11-23 DIAGNOSIS — I714 Abdominal aortic aneurysm, without rupture, unspecified: Secondary | ICD-10-CM | POA: Diagnosis present

## 2017-11-23 DIAGNOSIS — Z91040 Latex allergy status: Secondary | ICD-10-CM

## 2017-11-23 DIAGNOSIS — I10 Essential (primary) hypertension: Secondary | ICD-10-CM | POA: Diagnosis not present

## 2017-11-23 DIAGNOSIS — Z7982 Long term (current) use of aspirin: Secondary | ICD-10-CM

## 2017-11-23 DIAGNOSIS — Z79890 Hormone replacement therapy (postmenopausal): Secondary | ICD-10-CM

## 2017-11-23 DIAGNOSIS — Z888 Allergy status to other drugs, medicaments and biological substances status: Secondary | ICD-10-CM

## 2017-11-23 DIAGNOSIS — L219 Seborrheic dermatitis, unspecified: Secondary | ICD-10-CM | POA: Diagnosis not present

## 2017-11-23 DIAGNOSIS — Z8249 Family history of ischemic heart disease and other diseases of the circulatory system: Secondary | ICD-10-CM

## 2017-11-23 DIAGNOSIS — E039 Hypothyroidism, unspecified: Secondary | ICD-10-CM | POA: Diagnosis not present

## 2017-11-23 DIAGNOSIS — Z880 Allergy status to penicillin: Secondary | ICD-10-CM

## 2017-11-23 DIAGNOSIS — Z87891 Personal history of nicotine dependence: Secondary | ICD-10-CM | POA: Diagnosis not present

## 2017-11-23 DIAGNOSIS — M069 Rheumatoid arthritis, unspecified: Secondary | ICD-10-CM | POA: Diagnosis not present

## 2017-11-23 DIAGNOSIS — Z881 Allergy status to other antibiotic agents status: Secondary | ICD-10-CM | POA: Diagnosis not present

## 2017-11-23 DIAGNOSIS — K219 Gastro-esophageal reflux disease without esophagitis: Secondary | ICD-10-CM | POA: Diagnosis not present

## 2017-11-23 DIAGNOSIS — E785 Hyperlipidemia, unspecified: Secondary | ICD-10-CM | POA: Diagnosis present

## 2017-11-23 DIAGNOSIS — Z85828 Personal history of other malignant neoplasm of skin: Secondary | ICD-10-CM

## 2017-11-23 DIAGNOSIS — Z95828 Presence of other vascular implants and grafts: Secondary | ICD-10-CM

## 2017-11-23 DIAGNOSIS — Z809 Family history of malignant neoplasm, unspecified: Secondary | ICD-10-CM

## 2017-11-23 DIAGNOSIS — Z8679 Personal history of other diseases of the circulatory system: Secondary | ICD-10-CM

## 2017-11-23 DIAGNOSIS — Z882 Allergy status to sulfonamides status: Secondary | ICD-10-CM | POA: Diagnosis not present

## 2017-11-23 HISTORY — DX: Abdominal aortic aneurysm, without rupture: I71.4

## 2017-11-23 HISTORY — DX: Abdominal aortic aneurysm, without rupture, unspecified: I71.40

## 2017-11-23 HISTORY — PX: ABDOMINAL AORTIC ENDOVASCULAR STENT GRAFT: SHX5707

## 2017-11-23 LAB — BLOOD GAS, ARTERIAL
Acid-Base Excess: 3.1 mmol/L — ABNORMAL HIGH (ref 0.0–2.0)
Bicarbonate: 26.8 mmol/L (ref 20.0–28.0)
Drawn by: 449841
FIO2: 21
O2 Saturation: 96.4 %
Patient temperature: 98.6
pCO2 arterial: 38.8 mmHg (ref 32.0–48.0)
pH, Arterial: 7.454 — ABNORMAL HIGH (ref 7.350–7.450)
pO2, Arterial: 127 mmHg — ABNORMAL HIGH (ref 83.0–108.0)

## 2017-11-23 LAB — COMPREHENSIVE METABOLIC PANEL
ALT: 10 U/L — ABNORMAL LOW (ref 14–54)
AST: 18 U/L (ref 15–41)
Albumin: 3.5 g/dL (ref 3.5–5.0)
Alkaline Phosphatase: 124 U/L (ref 38–126)
Anion gap: 11 (ref 5–15)
BUN: 23 mg/dL — ABNORMAL HIGH (ref 6–20)
CO2: 22 mmol/L (ref 22–32)
Calcium: 9 mg/dL (ref 8.9–10.3)
Chloride: 104 mmol/L (ref 101–111)
Creatinine, Ser: 0.94 mg/dL (ref 0.44–1.00)
GFR calc Af Amer: >60 mL/min (ref >60)
GFR calc non Af Amer: 58 mL/min — ABNORMAL LOW (ref >60)
Glucose, Bld: 85 mg/dL (ref 65–99)
Potassium: 3.6 mmol/L (ref 3.5–5.1)
Sodium: 137 mmol/L (ref 135–145)
Total Bilirubin: 0.5 mg/dL (ref 0.3–1.2)
Total Protein: 5.8 g/dL — ABNORMAL LOW (ref 6.5–8.1)

## 2017-11-23 LAB — ABO/RH: ABO/RH(D): O POS

## 2017-11-23 LAB — BASIC METABOLIC PANEL
Anion gap: 10 (ref 5–15)
BUN: 20 mg/dL (ref 6–20)
CO2: 24 mmol/L (ref 22–32)
Calcium: 8.6 mg/dL — ABNORMAL LOW (ref 8.9–10.3)
Chloride: 102 mmol/L (ref 101–111)
Creatinine, Ser: 0.89 mg/dL (ref 0.44–1.00)
GFR calc Af Amer: >60 mL/min (ref >60)
GFR calc non Af Amer: >60 mL/min (ref >60)
Glucose, Bld: 117 mg/dL — ABNORMAL HIGH (ref 65–99)
Potassium: 3.5 mmol/L (ref 3.5–5.1)
Sodium: 136 mmol/L (ref 135–145)

## 2017-11-23 LAB — PROTIME-INR
INR: 0.92
INR: 1.03
Prothrombin Time: 12.2 seconds (ref 11.4–15.2)
Prothrombin Time: 13.5 seconds (ref 11.4–15.2)

## 2017-11-23 LAB — APTT
aPTT: 39 seconds — ABNORMAL HIGH (ref 24–36)
aPTT: 42 seconds — ABNORMAL HIGH (ref 24–36)

## 2017-11-23 LAB — MAGNESIUM: Magnesium: 1.9 mg/dL (ref 1.7–2.4)

## 2017-11-23 LAB — CBC
HCT: 30.9 % — ABNORMAL LOW (ref 36.0–46.0)
HCT: 29.5 % — ABNORMAL LOW (ref 36.0–46.0)
Hemoglobin: 9.5 g/dL — ABNORMAL LOW (ref 12.0–15.0)
Hemoglobin: 9.3 g/dL — ABNORMAL LOW (ref 12.0–15.0)
MCH: 27.7 pg (ref 26.0–34.0)
MCH: 28.2 pg (ref 26.0–34.0)
MCHC: 30.7 g/dL (ref 30.0–36.0)
MCHC: 31.5 g/dL (ref 30.0–36.0)
MCV: 90.1 fL (ref 78.0–100.0)
MCV: 89.4 fL (ref 78.0–100.0)
Platelets: 174 10*3/uL (ref 150–400)
Platelets: 155 10*3/uL (ref 150–400)
RBC: 3.43 MIL/uL — ABNORMAL LOW (ref 3.87–5.11)
RBC: 3.3 MIL/uL — ABNORMAL LOW (ref 3.87–5.11)
RDW: 14.3 % (ref 11.5–15.5)
RDW: 14.3 % (ref 11.5–15.5)
WBC: 4.8 10*3/uL (ref 4.0–10.5)
WBC: 5.1 10*3/uL (ref 4.0–10.5)

## 2017-11-23 LAB — PREPARE RBC (CROSSMATCH)

## 2017-11-23 LAB — POCT ACTIVATED CLOTTING TIME: Activated Clotting Time: 246 seconds

## 2017-11-23 SURGERY — INSERTION, ENDOVASCULAR STENT GRAFT, AORTA, ABDOMINAL
Anesthesia: General

## 2017-11-23 MED ORDER — LACTATED RINGERS IV SOLN
INTRAVENOUS | Status: DC
  Administered 2017-11-23 (×2): via INTRAVENOUS

## 2017-11-23 MED ORDER — VANCOMYCIN HCL IN DEXTROSE 1-5 GM/200ML-% IV SOLN
1000.0000 mg | Freq: Two times a day (BID) | INTRAVENOUS | Status: DC
  Administered 2017-11-23: 1000 mg via INTRAVENOUS
  Filled 2017-11-23 (×2): qty 200

## 2017-11-23 MED ORDER — SUGAMMADEX SODIUM 200 MG/2ML IV SOLN
INTRAVENOUS | Status: AC
  Filled 2017-11-23: qty 2

## 2017-11-23 MED ORDER — SODIUM CHLORIDE 0.9 % IV SOLN: 500.0000 mL | Freq: Once | INTRAVENOUS | Status: DC | PRN

## 2017-11-23 MED ORDER — POTASSIUM CHLORIDE CRYS ER 20 MEQ PO TBCR: 20.0000 meq | EXTENDED_RELEASE_TABLET | Freq: Every day | ORAL | Status: DC | PRN

## 2017-11-23 MED ORDER — PANTOPRAZOLE SODIUM 40 MG PO TBEC
40.0000 mg | DELAYED_RELEASE_TABLET | Freq: Every day | ORAL | Status: DC
  Administered 2017-11-23 – 2017-11-24 (×2): 40 mg via ORAL
  Filled 2017-11-23 (×2): qty 1

## 2017-11-23 MED ORDER — SUGAMMADEX SODIUM 200 MG/2ML IV SOLN
INTRAVENOUS | Status: DC | PRN
  Administered 2017-11-23: 104 mg via INTRAVENOUS

## 2017-11-23 MED ORDER — ONDANSETRON HCL 4 MG/2ML IJ SOLN
INTRAMUSCULAR | Status: DC | PRN
  Administered 2017-11-23: 4 mg via INTRAVENOUS

## 2017-11-23 MED ORDER — CHLORHEXIDINE GLUCONATE 4 % EX LIQD: 60.0000 mL | Freq: Once | CUTANEOUS | Status: DC

## 2017-11-23 MED ORDER — LEFLUNOMIDE 20 MG PO TABS
20.0000 mg | ORAL_TABLET | Freq: Every day | ORAL | Status: DC
  Administered 2017-11-23: 20 mg via ORAL
  Filled 2017-11-23 (×2): qty 1

## 2017-11-23 MED ORDER — DEXAMETHASONE SODIUM PHOSPHATE 10 MG/ML IJ SOLN
INTRAMUSCULAR | Status: AC
  Filled 2017-11-23: qty 1

## 2017-11-23 MED ORDER — LACTATED RINGERS IV SOLN
INTRAVENOUS | Status: DC | PRN
  Administered 2017-11-23: 13:00:00 via INTRAVENOUS

## 2017-11-23 MED ORDER — ONDANSETRON HCL 4 MG/2ML IJ SOLN
INTRAMUSCULAR | Status: AC
  Filled 2017-11-23: qty 2

## 2017-11-23 MED ORDER — FENTANYL CITRATE (PF) 100 MCG/2ML IJ SOLN
25.0000 ug | INTRAMUSCULAR | Status: DC | PRN
  Administered 2017-11-23 (×2): 50 ug via INTRAVENOUS

## 2017-11-23 MED ORDER — DOCUSATE SODIUM 100 MG PO CAPS
100.0000 mg | ORAL_CAPSULE | Freq: Every day | ORAL | Status: DC
  Administered 2017-11-24: 100 mg via ORAL
  Filled 2017-11-23: qty 1

## 2017-11-23 MED ORDER — ACETAMINOPHEN 650 MG RE SUPP: 325.0000 mg | RECTAL | Status: DC | PRN

## 2017-11-23 MED ORDER — MIDAZOLAM HCL 2 MG/2ML IJ SOLN
INTRAMUSCULAR | Status: DC | PRN
  Administered 2017-11-23: 1 mg via INTRAVENOUS

## 2017-11-23 MED ORDER — MIDAZOLAM HCL 2 MG/2ML IJ SOLN: 0.5000 mg | Freq: Once | INTRAMUSCULAR | Status: DC | PRN

## 2017-11-23 MED ORDER — ALBUTEROL SULFATE (2.5 MG/3ML) 0.083% IN NEBU: 2.5000 mg | INHALATION_SOLUTION | Freq: Four times a day (QID) | RESPIRATORY_TRACT | Status: DC | PRN

## 2017-11-23 MED ORDER — MIDAZOLAM HCL 2 MG/2ML IJ SOLN
INTRAMUSCULAR | Status: AC
  Filled 2017-11-23: qty 2

## 2017-11-23 MED ORDER — FENTANYL CITRATE (PF) 100 MCG/2ML IJ SOLN
INTRAMUSCULAR | Status: DC | PRN
  Administered 2017-11-23: 100 ug via INTRAVENOUS

## 2017-11-23 MED ORDER — OXYCODONE-ACETAMINOPHEN 5-325 MG PO TABS
1.0000 | ORAL_TABLET | Freq: Four times a day (QID) | ORAL | Status: DC | PRN
  Administered 2017-11-24: 2 via ORAL
  Filled 2017-11-23: qty 2

## 2017-11-23 MED ORDER — PROPOFOL 10 MG/ML IV BOLUS
INTRAVENOUS | Status: DC | PRN
  Administered 2017-11-23: 80 mg via INTRAVENOUS

## 2017-11-23 MED ORDER — FENTANYL CITRATE (PF) 100 MCG/2ML IJ SOLN
INTRAMUSCULAR | Status: AC
  Filled 2017-11-23: qty 2

## 2017-11-23 MED ORDER — VANCOMYCIN HCL IN DEXTROSE 1-5 GM/200ML-% IV SOLN
1000.0000 mg | INTRAVENOUS | Status: AC
  Administered 2017-11-23: 1000 mg via INTRAVENOUS
  Filled 2017-11-23: qty 200

## 2017-11-23 MED ORDER — METOPROLOL TARTRATE 5 MG/5ML IV SOLN: 2.0000 mg | INTRAVENOUS | Status: DC | PRN

## 2017-11-23 MED ORDER — ALUM & MAG HYDROXIDE-SIMETH 200-200-20 MG/5ML PO SUSP: 15.0000 mL | ORAL | Status: DC | PRN

## 2017-11-23 MED ORDER — HYDRALAZINE HCL 20 MG/ML IJ SOLN: 5.0000 mg | INTRAMUSCULAR | Status: DC | PRN

## 2017-11-23 MED ORDER — PHENOL 1.4 % MT LIQD: 1.0000 | OROMUCOSAL | Status: DC | PRN

## 2017-11-23 MED ORDER — LABETALOL HCL 5 MG/ML IV SOLN: 10.0000 mg | INTRAVENOUS | Status: DC | PRN

## 2017-11-23 MED ORDER — PROMETHAZINE HCL 25 MG/ML IJ SOLN: 6.2500 mg | INTRAMUSCULAR | Status: DC | PRN

## 2017-11-23 MED ORDER — PHENYLEPHRINE HCL 10 MG/ML IJ SOLN
INTRAVENOUS | Status: DC | PRN
  Administered 2017-11-23: 25 ug/min via INTRAVENOUS

## 2017-11-23 MED ORDER — ROCURONIUM BROMIDE 100 MG/10ML IV SOLN
INTRAVENOUS | Status: DC | PRN
  Administered 2017-11-23: 50 mg via INTRAVENOUS

## 2017-11-23 MED ORDER — OXYCODONE-ACETAMINOPHEN 5-325 MG PO TABS: 1.0000 | ORAL_TABLET | Freq: Four times a day (QID) | ORAL | 0 refills | Status: DC | PRN

## 2017-11-23 MED ORDER — HYDROCHLOROTHIAZIDE 25 MG PO TABS
25.0000 mg | ORAL_TABLET | Freq: Every day | ORAL | Status: DC
  Administered 2017-11-23 – 2017-11-24 (×2): 25 mg via ORAL
  Filled 2017-11-23 (×2): qty 1

## 2017-11-23 MED ORDER — ONDANSETRON HCL 4 MG/2ML IJ SOLN: 4.0000 mg | Freq: Four times a day (QID) | INTRAMUSCULAR | Status: DC | PRN

## 2017-11-23 MED ORDER — MORPHINE SULFATE (PF) 2 MG/ML IV SOLN
1.0000 mg | INTRAVENOUS | Status: DC | PRN
  Administered 2017-11-23 (×2): 1 mg via INTRAVENOUS
  Filled 2017-11-23 (×2): qty 1

## 2017-11-23 MED ORDER — ACETAMINOPHEN 325 MG PO TABS
325.0000 mg | ORAL_TABLET | ORAL | Status: DC | PRN
  Administered 2017-11-23: 650 mg via ORAL
  Filled 2017-11-23: qty 2

## 2017-11-23 MED ORDER — PROTAMINE SULFATE 10 MG/ML IV SOLN
INTRAVENOUS | Status: DC | PRN
  Administered 2017-11-23: 40 mg via INTRAVENOUS
  Administered 2017-11-23: 10 mg via INTRAVENOUS

## 2017-11-23 MED ORDER — SODIUM CHLORIDE 0.9 % IV SOLN: INTRAVENOUS | Status: DC

## 2017-11-23 MED ORDER — GUAIFENESIN-DM 100-10 MG/5ML PO SYRP: 15.0000 mL | ORAL_SOLUTION | ORAL | Status: DC | PRN

## 2017-11-23 MED ORDER — MAGNESIUM SULFATE 2 GM/50ML IV SOLN: 2.0000 g | Freq: Every day | INTRAVENOUS | Status: DC | PRN

## 2017-11-23 MED ORDER — CLOTRIMAZOLE 1 % EX CREA
TOPICAL_CREAM | Freq: Two times a day (BID) | CUTANEOUS | Status: DC
  Filled 2017-11-23: qty 15

## 2017-11-23 MED ORDER — BISACODYL 10 MG RE SUPP: 10.0000 mg | Freq: Every day | RECTAL | Status: DC | PRN

## 2017-11-23 MED ORDER — PROPOFOL 10 MG/ML IV BOLUS
INTRAVENOUS | Status: AC
  Filled 2017-11-23: qty 20

## 2017-11-23 MED ORDER — SODIUM CHLORIDE 0.9 % IV SOLN
INTRAVENOUS | Status: DC | PRN
  Administered 2017-11-23: 500 mL

## 2017-11-23 MED ORDER — HEPARIN SODIUM (PORCINE) 1000 UNIT/ML IJ SOLN
INTRAMUSCULAR | Status: DC | PRN
  Administered 2017-11-23: 5000 [IU] via INTRAVENOUS

## 2017-11-23 MED ORDER — ROCURONIUM BROMIDE 10 MG/ML (PF) SYRINGE
PREFILLED_SYRINGE | INTRAVENOUS | Status: AC
  Filled 2017-11-23: qty 5

## 2017-11-23 MED ORDER — LIDOCAINE 2% (20 MG/ML) 5 ML SYRINGE
INTRAMUSCULAR | Status: DC | PRN
  Administered 2017-11-23: 30 mg via INTRAVENOUS

## 2017-11-23 MED ORDER — HEPARIN SODIUM (PORCINE) 5000 UNIT/ML IJ SOLN: 5000.0000 [IU] | Freq: Three times a day (TID) | INTRAMUSCULAR | Status: DC

## 2017-11-23 MED ORDER — MEPERIDINE HCL 50 MG/ML IJ SOLN: 6.2500 mg | INTRAMUSCULAR | Status: DC | PRN

## 2017-11-23 MED ORDER — PROTAMINE SULFATE 10 MG/ML IV SOLN
INTRAVENOUS | Status: AC
  Filled 2017-11-23: qty 5

## 2017-11-23 MED ORDER — IODIXANOL 320 MG/ML IV SOLN
INTRAVENOUS | Status: DC | PRN
  Administered 2017-11-23: 100 mL via INTRAVENOUS

## 2017-11-23 MED ORDER — POLYETHYLENE GLYCOL 3350 17 G PO PACK: 17.0000 g | PACK | Freq: Every day | ORAL | Status: DC | PRN

## 2017-11-23 MED ORDER — FENTANYL CITRATE (PF) 250 MCG/5ML IJ SOLN
INTRAMUSCULAR | Status: AC
  Filled 2017-11-23: qty 5

## 2017-11-23 MED ORDER — DEXAMETHASONE SODIUM PHOSPHATE 10 MG/ML IJ SOLN
INTRAMUSCULAR | Status: DC | PRN
  Administered 2017-11-23: 10 mg via INTRAVENOUS

## 2017-11-23 MED ORDER — SODIUM CHLORIDE 0.9 % IV SOLN
INTRAVENOUS | Status: DC
  Administered 2017-11-23: 17:00:00 via INTRAVENOUS

## 2017-11-23 MED ORDER — LEVOTHYROXINE SODIUM 150 MCG PO TABS
225.0000 ug | ORAL_TABLET | Freq: Every day | ORAL | Status: DC
  Administered 2017-11-24: 225 ug via ORAL
  Filled 2017-11-23: qty 1.5
  Filled 2017-11-23: qty 3

## 2017-11-23 SURGICAL SUPPLY — 69 items
BLADE CLIPPER SURG (BLADE) IMPLANT
CANISTER SUCT 3000ML PPV (MISCELLANEOUS) ×3 IMPLANT
CATH ANGIO 5F BER2 65CM (CATHETERS) ×3 IMPLANT
CATH BEACON 5.038 65CM KMP-01 (CATHETERS) IMPLANT
CATH FOLEY LATEX FREE 16FR (CATHETERS) ×2
CATH FOLEY LF 16FR (CATHETERS) ×1 IMPLANT
CATH OMNI FLUSH .035X70CM (CATHETERS) ×3 IMPLANT
COVER PROBE W GEL 5X96 (DRAPES) IMPLANT
DERMABOND ADVANCED (GAUZE/BANDAGES/DRESSINGS) ×4
DERMABOND ADVANCED .7 DNX12 (GAUZE/BANDAGES/DRESSINGS) ×2 IMPLANT
DEVICE CLOSURE PERCLS PRGLD 6F (VASCULAR PRODUCTS) ×4 IMPLANT
DRAPE ZERO GRAVITY STERILE (DRAPES) ×3 IMPLANT
DRSG TEGADERM 2-3/8X2-3/4 SM (GAUZE/BANDAGES/DRESSINGS) ×6 IMPLANT
DRYSEAL FLEXSHEATH 12FR 33CM (SHEATH) ×2
DRYSEAL FLEXSHEATH 16FR 33CM (SHEATH) ×2
ELECT CAUTERY BLADE 6.4 (BLADE) ×3 IMPLANT
ELECT REM PT RETURN 9FT ADLT (ELECTROSURGICAL) ×3
ELECTRODE REM PT RTRN 9FT ADLT (ELECTROSURGICAL) ×1 IMPLANT
EXCLUDER TNK LEG 26MX12X14 (Endovascular Graft) ×1 IMPLANT
EXCLUDER TRUNK LEG 26MX12X14 (Endovascular Graft) ×3 IMPLANT
EXTENDER ENDOPROSTHESIS 12X7 (Endovascular Graft) ×3 IMPLANT
GAUZE SPONGE 2X2 8PLY NS (GAUZE/BANDAGES/DRESSINGS) ×6 IMPLANT
GAUZE SPONGE 2X2 8PLY STRL LF (GAUZE/BANDAGES/DRESSINGS) ×1 IMPLANT
GLOVE BIOGEL PI IND STRL 6.5 (GLOVE) ×1 IMPLANT
GLOVE BIOGEL PI IND STRL 7.0 (GLOVE) ×1 IMPLANT
GLOVE BIOGEL PI IND STRL 7.5 (GLOVE) ×1 IMPLANT
GLOVE BIOGEL PI INDICATOR 6.5 (GLOVE) ×2
GLOVE BIOGEL PI INDICATOR 7.0 (GLOVE) ×2
GLOVE BIOGEL PI INDICATOR 7.5 (GLOVE) ×2
GLOVE SURG SS PI 6.0 STRL IVOR (GLOVE) ×3 IMPLANT
GLOVE SURG SS PI 6.5 STRL IVOR (GLOVE) ×9 IMPLANT
GLOVE SURG SS PI 7.0 STRL IVOR (GLOVE) ×3 IMPLANT
GLOVE SURG SS PI 7.5 STRL IVOR (GLOVE) ×3 IMPLANT
GOWN STRL REUS W/ TWL LRG LVL3 (GOWN DISPOSABLE) ×3 IMPLANT
GOWN STRL REUS W/ TWL XL LVL3 (GOWN DISPOSABLE) ×1 IMPLANT
GOWN STRL REUS W/TWL LRG LVL3 (GOWN DISPOSABLE) ×6
GOWN STRL REUS W/TWL XL LVL3 (GOWN DISPOSABLE) ×2
GRAFT BALLN CATH 65CM (STENTS) IMPLANT
HEMOSTAT SNOW SURGICEL 2X4 (HEMOSTASIS) IMPLANT
KIT BASIN OR (CUSTOM PROCEDURE TRAY) ×3 IMPLANT
KIT ROOM TURNOVER OR (KITS) ×3 IMPLANT
LEG CONTRALATERAL 16X12X12 (Vascular Products) ×3 IMPLANT
NEEDLE PERC 18GX7CM (NEEDLE) ×3 IMPLANT
NS IRRIG 1000ML POUR BTL (IV SOLUTION) IMPLANT
PACK ENDOVASCULAR (PACKS) ×3 IMPLANT
PAD ARMBOARD 7.5X6 YLW CONV (MISCELLANEOUS) ×6 IMPLANT
PENCIL BUTTON HOLSTER BLD 10FT (ELECTRODE) ×3 IMPLANT
PERCLOSE PROGLIDE 6F (VASCULAR PRODUCTS) ×12
SHEATH AVANTI 11CM 8FR (MISCELLANEOUS) ×3 IMPLANT
SHEATH BRITE TIP 8FR 23CM (MISCELLANEOUS) ×3 IMPLANT
SHEATH DRYSEAL FLEX 12FR 33CM (SHEATH) ×1 IMPLANT
SHEATH DRYSEAL FLEX 16FR 33CM (SHEATH) ×1 IMPLANT
SHIELD RADPAD SCOOP 12X17 (MISCELLANEOUS) ×6 IMPLANT
SPONGE GAUZE 2X2 STER 10/PKG (GAUZE/BANDAGES/DRESSINGS) ×2
STENT GRAFT BALLN CATH 65CM (STENTS)
STOPCOCK MORSE 400PSI 3WAY (MISCELLANEOUS) ×3 IMPLANT
SUT ETHILON 3 0 PS 1 (SUTURE) ×3 IMPLANT
SUT PROLENE 5 0 C 1 24 (SUTURE) IMPLANT
SUT VIC AB 2-0 CT1 27 (SUTURE)
SUT VIC AB 2-0 CT1 TAPERPNT 27 (SUTURE) IMPLANT
SUT VIC AB 3-0 SH 27 (SUTURE)
SUT VIC AB 3-0 SH 27X BRD (SUTURE) IMPLANT
SUT VICRYL 4-0 PS2 18IN ABS (SUTURE) IMPLANT
SYR 30ML LL (SYRINGE) ×3 IMPLANT
TOWEL GREEN STERILE (TOWEL DISPOSABLE) ×6 IMPLANT
TRAY FOLEY MTR SLVR 16FR STAT (CATHETERS) IMPLANT
TUBING HIGH PRESSURE 120CM (CONNECTOR) ×3 IMPLANT
WIRE AMPLATZ SS-J .035X180CM (WIRE) ×6 IMPLANT
WIRE BENTSON .035X145CM (WIRE) ×6 IMPLANT

## 2017-11-23 NOTE — Transfer of Care (Signed)
Immediate Anesthesia Transfer of Care Note  Patient: Joanne Whitehead  Procedure(s) Performed: ABDOMINAL AORTIC ENDOVASCULAR STENT GRAFT (N/A )  Patient Location: PACU  Anesthesia Type:General  Level of Consciousness: awake, alert  and oriented  Airway & Oxygen Therapy: Patient Spontanous Breathing and Patient connected to nasal cannula oxygen  Post-op Assessment: Report given to RN, Post -op Vital signs reviewed and stable and Patient moving all extremities  Post vital signs: Reviewed and stable  Last Vitals:  Vitals:   11/23/17 1020 11/23/17 1434  BP: (!) 145/77   Pulse: 63   Resp: 16   Temp: 36.5 C (!) 36.2 C  SpO2: 97%     Last Pain:  Vitals:   11/23/17 1055  TempSrc:   PainSc: 2       Patients Stated Pain Goal: 2 (95/63/87 5643)  Complications: No apparent anesthesia complications

## 2017-11-23 NOTE — Anesthesia Procedure Notes (Signed)
Procedure Name: Intubation Date/Time: 11/23/2017 12:37 PM Performed by: Leonor Liv, CRNA Pre-anesthesia Checklist: Patient identified, Emergency Drugs available, Suction available and Patient being monitored Patient Re-evaluated:Patient Re-evaluated prior to induction Oxygen Delivery Method: Circle System Utilized Preoxygenation: Pre-oxygenation with 100% oxygen Induction Type: IV induction Ventilation: Mask ventilation without difficulty Laryngoscope Size: Mac and 3 Grade View: Grade I Tube type: Oral Tube size: 7.0 mm Number of attempts: 1 Airway Equipment and Method: Stylet and Oral airway Placement Confirmation: ETT inserted through vocal cords under direct vision,  positive ETCO2 and breath sounds checked- equal and bilateral Secured at: 20 cm Tube secured with: Tape Dental Injury: Teeth and Oropharynx as per pre-operative assessment

## 2017-11-23 NOTE — Progress Notes (Signed)
  Day of Surgery Note    Subjective:  No complaints   Vitals:   11/23/17 1020 11/23/17 1434  BP: (!) 145/77   Pulse: 63   Resp: 16   Temp: 97.7 F (36.5 C) (!) 97.2 F (36.2 C)  SpO2: 97%     Incisions:   Bilateral groins are soft without hematoma Extremities:  + palpable DP pulses bilaterally Cardiac:  regualr Lungs:  Non labored Abdomen:  Soft and non tender   Assessment/Plan:  This is a 77 y.o. female who is s/p  EVAR  -pt doing well in recovery room.  -to [facility] when bed available -if tonight is uneventful, anticipate discharge tomorrow.   Leontine Locket, PA-C 11/23/2017 2:51 PM [phone]

## 2017-11-23 NOTE — Progress Notes (Signed)
Patient arrived from the cath lab to 4E room 20 status post aortic stenting using right and left femoral arteries.  Patient continues on bedrest with bathroom privileges until 8:30 pm. Telemetry monitor placed and CCMD notified.  Patient oriented to unit and room to include call light and phone.  Will continue to monitor.

## 2017-11-23 NOTE — Discharge Instructions (Signed)
° °[facility]  ° °Discharge Instructions ° °Endovascular Aortic Aneurysm Repair ° °Please refer to the following instructions for your post-procedure care. Your surgeon or Physician Assistant will discuss any changes with you. ° °Activity ° °You are encouraged to walk as much as you can. You can slowly return to normal activities but must avoid strenuous activity and heavy lifting until your doctor tells you it's OK. Avoid activities such as vacuuming or swinging a gold club. It is normal to feel tired for several weeks after your surgery. Do not drive until your doctor gives the OK and you are no longer taking prescription pain medications. It is also normal to have difficulty with sleep habits, eating, and bowel movements after surgery. These will go away with time. ° °Bathing/Showering ° °You may shower after you go home. If you have an incision, do not soak in a bathtub, hot tub, or swim until the incision heals completely. ° °If you have incisions in your groin, sash the groin wounds with soap and water daily and pat dry. (No tub bath-only shower)  Then put a dry gauze or washcloth there to keep this area dry to help prevent wound infection daily and as needed.  Do not use Vaseline or neosporin on your incisions.  Only use soap and water on your incisions and then protect and keep dry. ° °Incision Care ° °Shower every day. Clean your incision with mild soap and water. Pat the area dry with a clean towel. You do not need a bandage unless otherwise instructed. Do not apply any ointments or creams to your incision. If you clothing is irritating, you may cover your incision with a dry gauze pad. ° °Diet ° °Resume your normal diet. There are no special food restrictions following this procedure. A low fat/low cholesterol diet is recommended for all patients with vascular disease. In order to heal from your surgery, it is CRITICAL to get adequate nutrition. Your body requires  vitamins, minerals, and protein. Vegetables are the best source of vitamins and minerals. Vegetables also provide the perfect balance of protein. Processed food has little nutritional value, so try to avoid this. ° °Medications ° °Resume taking all of your medications unless your doctor or nurse practitioner tells you not to. If your incision is causing pain, you may take over-the-counter pain relievers such as acetaminophen (Tylenol). If you were prescribed a stronger pain medication, please be aware these medications can cause nausea and constipation. Prevent nausea by taking the medication with a snack or meal. Avoid constipation by drinking plenty of fluids and eating foods with a high amount of fiber, such as fruits, vegetables, and grains. Do not take Tylenol if you are taking prescription pain medications. ° ° °Follow up ° °Our office will schedule a follow-up appointment with a C.T. scan 3-4 weeks after your surgery. ° °Please call us immediately for any of the following conditions ° °Severe or worsening pain in your legs or feet or in your abdomen back or chest. °Increased pain, redness, drainage (pus) from your incision sit. °Increased abdominal pain, bloating, nausea, vomiting or persistent diarrhea. °Fever of 101 degrees or higher. °Swelling in your leg (s), ° °Reduce your risk of vascular disease ° °•Stop smoking. If you would like help call [facility] at 1-800-QUIT-NOW (1-800-784-8669) or [facility] at 336-586-4000. °•Manage your cholesterol °•Maintain a desired weight °•Control your diabetes °•Keep your blood pressure down ° °If you have questions, please call the office at [phone]. ° °

## 2017-11-23 NOTE — Op Note (Signed)
Patient name: Joanne Whitehead MRN: [id number] DOB: 07-08-1941 Sex: female  11/23/2017 Pre-operative Diagnosis: AAA Post-operative diagnosis:  Same Surgeon:  Durene Cal Assistants:  Todd Early Procedure:   #1: Endovascular repair of infrarenal abdominal aortic aneurysm   #2: Ultrasound-guided access, bilateral common femoral arteries   #3: Catheter in aorta x2   #4: Abdominal aortogram Anesthesia:  General Blood Loss:  Minimal Specimens:  none  Findings:  Complete exclusion  Devices Used: Main body was primary left Gouru 26 x 12 x 14.  Ipsilateral extension was Gouru 12 x 7.  Contralateral right was a Gouru 12 x 12  Indications: The patient has a 5.1 cm infrarenal abdominal aortic aneurysm.  We discussed proceeding with endovascular repair.  Risks and benefits were discussed with the patient and she was willing to proceed.  Procedure:  The patient was identified in the holding area and taken to [facility] OR ROOM 16  The patient was then placed supine on the table. general anesthesia was administered.  The patient was prepped and draped in the usual sterile fashion.  A time out was called and antibiotics were administered.  Ultrasound was used to evaluate bilateral common femoral arteries which are widely patent without significant calcification.  A #11 blade was used to make a skin nick bilaterally.  Under ultrasound guidance, bilateral common femoral arteries were cannulated with an 18-gauge needle under ultrasound guidance.  An 035 wire was advanced without resistance into the aorta.  8 [place] dilators were used to dilate the subcutaneous tissue.  Probe glide devices were deployed at the 11:00 and 1 o'clock position for pre-closure.  8 French sheaths were placed bilaterally.  The patient was fully heparinized.  A Omni Flush catheter was advanced to the level of L1 from the right side.  A wire exchange was performed on the left and an Amplatz Super Stiff wire was placed into the descending  thoracic aorta.  A 16 French sheath was placed on the left side.  The main body device was prepared on the back table.  This was a Gouru 26 x 12 x 14 device.  An abdominal aortogram was performed locating the renal arteries.  There was somewhat of a tortuous aortic neck.  The device was then deployed at the level of the renal arteries down to the contralateral gate.  An abdominal aortogram was then repeated to confirm that the device was in good position.  Next using a Berenstein 2 catheter and a Bentson wire, the contralateral gate was cannulated.  The Berenstein 2 catheter was removed and an Omni Flush catheter was advanced into the main body.  It was able to be freely rotated, confirming successful cannulation.  An Amplatz Super Stiff wire was placed.  The image detector was then rotated to a left anterior oblique position and a retrograde arteriogram was performed through the sheath, locating the right hypogastric artery.  A 12 French sheath was then placed as the 8 [place] sheath was removed.  It was advanced into the contralateral gate.  The right limb was prepared on the back table and inserted.  This was a 12 x 12 device.  It was successfully deployed at the level of the right hypogastric artery.  Next, the image detector was rotated to a right anterior oblique position.  A retrograde injection was performed through the sheath locating the left hypogastric artery.  The main body was then deployed landing just proximal to the left hypogastric artery.  Next, a 37 mm of  the balloon was used to mold the proximal and distal attachment sites as well as device overlap.  An Omni Flush catheter was then advanced above the graft and a completion arteriogram was performed.  This showed no evidence of a type I a leak however there was a type Ib leak from the left side.  The renal arteries remain patent as did bilateral hypogastric arteries.  In order to exclude the type Ib leak on the left, I placed a distal extension.   This was a Gouru 12 x 7 device.  It was placed at the level of the left hypogastric artery.  It was then molded again with a end of the balloon.  A completion arteriogram was then performed with the catheter proximal to the graft.  This showed complete resolution of the type Ib endoleak and successful exclusion of the aneurysm.  The Amplatz Super Stiff wires were removed and Bentson wires were placed.  The sheaths were then removed and the probe glide devices were secured, closing the arteriotomies.  50 mg of protamine was given.  Manual pressure was held for 5 minutes.  Bovie cautery was used to reapproximate the subtenons tissue.  Dermabond was placed on the incisions.  The patient had brisk Doppler signals at her feet.  She was then successfully extubated and taken to recovery room in stable condition.   Disposition: To PACU stable   V. Durene Cal, M.D. [facility] Office: [phone] Pager:  [phone]
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
## 2017-11-23 NOTE — Anesthesia Postprocedure Evaluation (Signed)
Anesthesia Post Note  Patient: Joanne Whitehead  Procedure(s) Performed: ABDOMINAL AORTIC ENDOVASCULAR STENT GRAFT (N/A )     Patient location during evaluation: PACU Anesthesia Type: General Level of consciousness: awake and alert, patient cooperative and oriented Pain management: pain level controlled Vital Signs Assessment: post-procedure vital signs reviewed and stable Respiratory status: spontaneous breathing, nonlabored ventilation, respiratory function stable and patient connected to nasal cannula oxygen Cardiovascular status: blood pressure returned to baseline and stable Postop Assessment: no apparent nausea or vomiting Anesthetic complications: no    Last Vitals:  Vitals:   11/23/17 1020 11/23/17 1434  BP: (!) 145/77   Pulse: 63   Resp: 16   Temp: 36.5 C (!) 36.2 C  SpO2: 97%     Last Pain:  Vitals:   11/23/17 1434  TempSrc:   PainSc: 0-No pain                 Dreamer Carillo,E. Shunda Rabadi

## 2017-11-23 NOTE — Anesthesia Procedure Notes (Signed)
Arterial Line Insertion Start/End2/03/2018 12:10 PM, 11/23/2017 12:15 PM Performed by: Leonor Liv, CRNA, CRNA  Patient location: Pre-op. Preanesthetic checklist: patient identified, IV checked, site marked, risks and benefits discussed, surgical consent, monitors and equipment checked, pre-op evaluation and timeout performed Lidocaine 1% used for infiltration Right, radial was placed Catheter size: 20 G Hand hygiene performed  and maximum sterile barriers used  Allen's test indicative of satisfactory collateral circulation Procedure performed without using ultrasound guided technique. Following insertion, Biopatch and dressing applied. Post procedure assessment: normal  Patient tolerated the procedure well with no immediate complications.
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
## 2017-11-24 ENCOUNTER — Encounter (HOSPITAL_COMMUNITY): Payer: Self-pay | Admitting: Surgery

## 2017-11-24 ENCOUNTER — Telehealth: Payer: Self-pay | Admitting: *Deleted

## 2017-11-24 LAB — BASIC METABOLIC PANEL
Anion gap: 10 (ref 5–15)
BUN: 25 mg/dL — ABNORMAL HIGH (ref 6–20)
CO2: 24 mmol/L (ref 22–32)
Calcium: 8.3 mg/dL — ABNORMAL LOW (ref 8.9–10.3)
Chloride: 103 mmol/L (ref 101–111)
Creatinine, Ser: 1.13 mg/dL — ABNORMAL HIGH (ref 0.44–1.00)
GFR calc Af Amer: 54 mL/min — ABNORMAL LOW (ref >60)
GFR calc non Af Amer: 46 mL/min — ABNORMAL LOW (ref >60)
Glucose, Bld: 99 mg/dL (ref 65–99)
Potassium: 4.1 mmol/L (ref 3.5–5.1)
Sodium: 137 mmol/L (ref 135–145)

## 2017-11-24 LAB — CBC
HCT: 26.1 % — ABNORMAL LOW (ref 36.0–46.0)
Hemoglobin: 8.2 g/dL — ABNORMAL LOW (ref 12.0–15.0)
MCH: 28.1 pg (ref 26.0–34.0)
MCHC: 31.4 g/dL (ref 30.0–36.0)
MCV: 89.4 fL (ref 78.0–100.0)
Platelets: 162 10*3/uL (ref 150–400)
RBC: 2.92 MIL/uL — ABNORMAL LOW (ref 3.87–5.11)
RDW: 14.2 % (ref 11.5–15.5)
WBC: 6.5 10*3/uL (ref 4.0–10.5)

## 2017-11-24 MED ORDER — ATORVASTATIN CALCIUM 10 MG PO TABS: 10.0000 mg | ORAL_TABLET | Freq: Every day | ORAL | 2 refills | Status: DC

## 2017-11-24 MED ORDER — ASPIRIN EC 81 MG PO TBEC: 81.0000 mg | DELAYED_RELEASE_TABLET | Freq: Every day | ORAL | Status: DC

## 2017-11-24 NOTE — Care Management Note (Signed)
Case Management Note Marvetta Gibbons RN, BSN Unit 4E-Case Manager [phone]  Patient Details  Name: Joanne Whitehead MRN: [id number] Date of Birth: 12/07/1940  Subjective/Objective:     Pt admitted s/p AAA repair               Action/Plan: PTA pt lived at home- notified by [facility] with [facility] pt has pre-op referral for any HH needs- they will f/u with pt post-op on return home- no further CM needs noted for transition home.   Expected Discharge Date:  11/24/17               Expected Discharge Plan:  [place]  In-House Referral:  NA  Discharge planning Services  CM Consult  Post Acute Care Choice:  Home Health Choice offered to:  NA  DME Arranged:    DME Agency:     HH Arranged:    HH Agency:  [facility]  Status of Service:  Completed, signed off  If discussed at [place] of Stay Meetings, dates discussed:    Discharge Disposition: home/home health   Additional Comments:  Dawayne Patricia, RN 11/24/2017, 11:22 AM
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
## 2017-11-24 NOTE — Progress Notes (Signed)
Order for discharge received, telemetry monitor removed and CC MD notified.  PIV access removed, discharge instructions, follow up, medications and instructions for their use discussed with the patient.

## 2017-11-24 NOTE — Progress Notes (Addendum)
  Progress Note    11/24/2017 7:22 AM 1 Day Post-Op  Subjective:  Wants to go home-says her sister is dying and hopes she hasn't passed yet.   Afebrile HR 40'J-81'X NSR 914'N systolic 82% RA  Vitals:   11/23/17 2016 11/24/17 0321  BP: 117/72 112/69  Pulse: 83 (!) 52  Resp: 20 19  Temp: 98.4 F (36.9 C) 98.2 F (36.8 C)  SpO2: 97% 98%    Physical Exam: Cardiac:  regular Lungs:  Non labored Incisions:  Bilateral groins are soft without hematoma Extremities:  Faintly palpable DP pulses bilaterally Abdomen:  Soft, NT/ND  CBC    Component Value Date/Time   WBC 6.5 11/24/2017 0254   RBC 2.92 (L) 11/24/2017 0254   HGB 8.2 (L) 11/24/2017 0254   HCT 26.1 (L) 11/24/2017 0254   PLT 162 11/24/2017 0254   MCV 89.4 11/24/2017 0254   MCH 28.1 11/24/2017 0254   MCHC 31.4 11/24/2017 0254   RDW 14.2 11/24/2017 0254    BMET    Component Value Date/Time   NA 137 11/24/2017 0254   K 4.1 11/24/2017 0254   CL 103 11/24/2017 0254   CO2 24 11/24/2017 0254   GLUCOSE 99 11/24/2017 0254   BUN 25 (H) 11/24/2017 0254   CREATININE 1.13 (H) 11/24/2017 0254   CALCIUM 8.3 (L) 11/24/2017 0254   GFRNONAA 46 (L) 11/24/2017 0254   GFRAA 54 (L) 11/24/2017 0254    INR    Component Value Date/Time   INR 1.03 11/23/2017 1500     Intake/Output Summary (Last 24 hours) at 11/24/2017 9562 Last data filed at 11/24/2017 0636 Gross per 24 hour  Intake 1661.67 ml  Output 520 ml  Net 1141.67 ml     Assessment:  77 y.o. female is s/p:  EVAR  1 Day Post-Op  Plan: -pt doing well this am-she has not walked-I have asked the nurse to walk with her in the hallways.  She has faintly palpable DP pulses bilaterally -will discharge her home this am.  She will f/u in 4 weeks with CTA -DVT prophylaxis:  SQ heparin to start later today.   -will start baby aspirin and low dose statin   Leontine Locket, PA-C [facility] [phone] 11/24/2017 7:22 AM

## 2017-11-24 NOTE — Progress Notes (Signed)
Patient ambulated in hallway without difficulty.

## 2017-11-25 DIAGNOSIS — I10 Essential (primary) hypertension: Secondary | ICD-10-CM | POA: Diagnosis not present

## 2017-11-25 DIAGNOSIS — E039 Hypothyroidism, unspecified: Secondary | ICD-10-CM | POA: Diagnosis not present

## 2017-11-25 DIAGNOSIS — Z48812 Encounter for surgical aftercare following surgery on the circulatory system: Secondary | ICD-10-CM | POA: Diagnosis not present

## 2017-11-25 DIAGNOSIS — E785 Hyperlipidemia, unspecified: Secondary | ICD-10-CM | POA: Diagnosis not present

## 2017-11-25 DIAGNOSIS — I714 Abdominal aortic aneurysm, without rupture: Secondary | ICD-10-CM | POA: Diagnosis not present

## 2017-11-25 DIAGNOSIS — M069 Rheumatoid arthritis, unspecified: Secondary | ICD-10-CM | POA: Diagnosis not present

## 2017-11-25 LAB — BPAM RBC
Blood Product Expiration Date: 201903092359
Blood Product Expiration Date: 201903092359
ISSUE DATE / TIME: 201902051541
ISSUE DATE / TIME: 201902051541
Unit Type and Rh: 5100
Unit Type and Rh: 5100

## 2017-11-25 LAB — TYPE AND SCREEN
ABO/RH(D): O POS
Antibody Screen: NEGATIVE
Unit division: 0
Unit division: 0

## 2017-11-25 NOTE — Consult Note (Signed)
[facility] CM Primary Care Navigator  11/25/2017  Joanne Whitehead Dec 04, 1940 [id number]   Wenttoseepatient at the bedsideto identify possible discharge needs but she was alreadydischargedper staff report.  PerMD note, patientwas seen for repair of abdominal aortic aneurysm.  Patient was discharged home yesterday with home health services ([facility]).  Primary care provider's office called (attempts to speak to Jerald Kief- unsuccessful) office receptionist/ call center kept transferring calls but no voicemail to leave a message.   Made another attempt to call primary care provider's office (spoke to Crystal) to notify of patient's discharge and need for post hospital follow-up and transition of care (TOC). Made aware to refer patient to [facility] care management if deemed necessary and appropriate for services.   For additional questions please contact:  Karin Golden A. Doni Widmer, BSN, RN-BC [facility] PRIMARY CARE Navigator Cell: [phone]

## 2017-11-28 ENCOUNTER — Other Ambulatory Visit: Payer: Self-pay

## 2017-11-28 DIAGNOSIS — M069 Rheumatoid arthritis, unspecified: Secondary | ICD-10-CM | POA: Diagnosis not present

## 2017-11-28 DIAGNOSIS — I10 Essential (primary) hypertension: Secondary | ICD-10-CM | POA: Diagnosis not present

## 2017-11-28 DIAGNOSIS — Z48812 Encounter for surgical aftercare following surgery on the circulatory system: Secondary | ICD-10-CM | POA: Diagnosis not present

## 2017-11-28 DIAGNOSIS — I714 Abdominal aortic aneurysm, without rupture, unspecified: Secondary | ICD-10-CM

## 2017-11-28 DIAGNOSIS — N390 Urinary tract infection, site not specified: Secondary | ICD-10-CM | POA: Diagnosis not present

## 2017-11-28 DIAGNOSIS — E785 Hyperlipidemia, unspecified: Secondary | ICD-10-CM | POA: Diagnosis not present

## 2017-11-28 DIAGNOSIS — E039 Hypothyroidism, unspecified: Secondary | ICD-10-CM | POA: Diagnosis not present

## 2017-11-29 DIAGNOSIS — I10 Essential (primary) hypertension: Secondary | ICD-10-CM | POA: Diagnosis not present

## 2017-11-29 DIAGNOSIS — M069 Rheumatoid arthritis, unspecified: Secondary | ICD-10-CM | POA: Diagnosis not present

## 2017-11-29 DIAGNOSIS — E039 Hypothyroidism, unspecified: Secondary | ICD-10-CM | POA: Diagnosis not present

## 2017-11-29 DIAGNOSIS — I714 Abdominal aortic aneurysm, without rupture: Secondary | ICD-10-CM | POA: Diagnosis not present

## 2017-11-29 DIAGNOSIS — Z48812 Encounter for surgical aftercare following surgery on the circulatory system: Secondary | ICD-10-CM | POA: Diagnosis not present

## 2017-11-29 DIAGNOSIS — E785 Hyperlipidemia, unspecified: Secondary | ICD-10-CM | POA: Diagnosis not present

## 2017-11-30 ENCOUNTER — Telehealth: Payer: Self-pay | Admitting: Surgery

## 2017-11-30 DIAGNOSIS — E039 Hypothyroidism, unspecified: Secondary | ICD-10-CM | POA: Diagnosis not present

## 2017-11-30 DIAGNOSIS — I10 Essential (primary) hypertension: Secondary | ICD-10-CM | POA: Diagnosis not present

## 2017-11-30 DIAGNOSIS — M069 Rheumatoid arthritis, unspecified: Secondary | ICD-10-CM | POA: Diagnosis not present

## 2017-11-30 DIAGNOSIS — E785 Hyperlipidemia, unspecified: Secondary | ICD-10-CM | POA: Diagnosis not present

## 2017-11-30 DIAGNOSIS — Z48812 Encounter for surgical aftercare following surgery on the circulatory system: Secondary | ICD-10-CM | POA: Diagnosis not present

## 2017-11-30 DIAGNOSIS — I714 Abdominal aortic aneurysm, without rupture: Secondary | ICD-10-CM | POA: Diagnosis not present

## 2017-11-30 NOTE — Telephone Encounter (Signed)
Sched appts 12/26/17; CTA at 1:00 at [facility] and MD at 2:30. Lm on hm# to inform pt of appts and cta instrcutions.

## 2017-11-30 NOTE — Telephone Encounter (Signed)
-----   Message from Mena Goes, RN sent at 11/23/2017  2:08 PM EST ----- Regarding: 4 weeks EVAR CTA  A/P postop   ----- Message ----- From: Gabriel Earing, PA-C Sent: 11/23/2017   1:40 PM To: Vvs Charge Pool  S/p EVAR 11/23/17.  F/u with Dr. Trula Slade in 4 weeks with CTA protcol.   Thanks

## 2017-12-02 DIAGNOSIS — E785 Hyperlipidemia, unspecified: Secondary | ICD-10-CM | POA: Diagnosis not present

## 2017-12-02 DIAGNOSIS — I10 Essential (primary) hypertension: Secondary | ICD-10-CM | POA: Diagnosis not present

## 2017-12-02 DIAGNOSIS — I714 Abdominal aortic aneurysm, without rupture: Secondary | ICD-10-CM | POA: Diagnosis not present

## 2017-12-02 DIAGNOSIS — E039 Hypothyroidism, unspecified: Secondary | ICD-10-CM | POA: Diagnosis not present

## 2017-12-02 DIAGNOSIS — M069 Rheumatoid arthritis, unspecified: Secondary | ICD-10-CM | POA: Diagnosis not present

## 2017-12-02 DIAGNOSIS — Z48812 Encounter for surgical aftercare following surgery on the circulatory system: Secondary | ICD-10-CM | POA: Diagnosis not present

## 2017-12-05 DIAGNOSIS — F329 Major depressive disorder, single episode, unspecified: Secondary | ICD-10-CM | POA: Diagnosis not present

## 2017-12-05 DIAGNOSIS — M546 Pain in thoracic spine: Secondary | ICD-10-CM | POA: Diagnosis not present

## 2017-12-06 DIAGNOSIS — E039 Hypothyroidism, unspecified: Secondary | ICD-10-CM | POA: Diagnosis not present

## 2017-12-06 DIAGNOSIS — I10 Essential (primary) hypertension: Secondary | ICD-10-CM | POA: Diagnosis not present

## 2017-12-06 DIAGNOSIS — I714 Abdominal aortic aneurysm, without rupture: Secondary | ICD-10-CM | POA: Diagnosis not present

## 2017-12-06 DIAGNOSIS — E785 Hyperlipidemia, unspecified: Secondary | ICD-10-CM | POA: Diagnosis not present

## 2017-12-06 DIAGNOSIS — Z48812 Encounter for surgical aftercare following surgery on the circulatory system: Secondary | ICD-10-CM | POA: Diagnosis not present

## 2017-12-06 DIAGNOSIS — M069 Rheumatoid arthritis, unspecified: Secondary | ICD-10-CM | POA: Diagnosis not present

## 2017-12-08 DIAGNOSIS — I714 Abdominal aortic aneurysm, without rupture: Secondary | ICD-10-CM | POA: Diagnosis not present

## 2017-12-08 DIAGNOSIS — Z48812 Encounter for surgical aftercare following surgery on the circulatory system: Secondary | ICD-10-CM | POA: Diagnosis not present

## 2017-12-08 DIAGNOSIS — E039 Hypothyroidism, unspecified: Secondary | ICD-10-CM | POA: Diagnosis not present

## 2017-12-08 DIAGNOSIS — E785 Hyperlipidemia, unspecified: Secondary | ICD-10-CM | POA: Diagnosis not present

## 2017-12-08 DIAGNOSIS — I10 Essential (primary) hypertension: Secondary | ICD-10-CM | POA: Diagnosis not present

## 2017-12-08 DIAGNOSIS — M069 Rheumatoid arthritis, unspecified: Secondary | ICD-10-CM | POA: Diagnosis not present

## 2017-12-13 DIAGNOSIS — I714 Abdominal aortic aneurysm, without rupture: Secondary | ICD-10-CM | POA: Diagnosis not present

## 2017-12-13 DIAGNOSIS — M069 Rheumatoid arthritis, unspecified: Secondary | ICD-10-CM | POA: Diagnosis not present

## 2017-12-13 DIAGNOSIS — I10 Essential (primary) hypertension: Secondary | ICD-10-CM | POA: Diagnosis not present

## 2017-12-13 DIAGNOSIS — E785 Hyperlipidemia, unspecified: Secondary | ICD-10-CM | POA: Diagnosis not present

## 2017-12-13 DIAGNOSIS — Z48812 Encounter for surgical aftercare following surgery on the circulatory system: Secondary | ICD-10-CM | POA: Diagnosis not present

## 2017-12-13 DIAGNOSIS — E039 Hypothyroidism, unspecified: Secondary | ICD-10-CM | POA: Diagnosis not present

## 2017-12-15 DIAGNOSIS — E039 Hypothyroidism, unspecified: Secondary | ICD-10-CM | POA: Diagnosis not present

## 2017-12-15 DIAGNOSIS — I714 Abdominal aortic aneurysm, without rupture: Secondary | ICD-10-CM | POA: Diagnosis not present

## 2017-12-15 DIAGNOSIS — Z48812 Encounter for surgical aftercare following surgery on the circulatory system: Secondary | ICD-10-CM | POA: Diagnosis not present

## 2017-12-15 DIAGNOSIS — E785 Hyperlipidemia, unspecified: Secondary | ICD-10-CM | POA: Diagnosis not present

## 2017-12-15 DIAGNOSIS — I10 Essential (primary) hypertension: Secondary | ICD-10-CM | POA: Diagnosis not present

## 2017-12-15 DIAGNOSIS — M069 Rheumatoid arthritis, unspecified: Secondary | ICD-10-CM | POA: Diagnosis not present

## 2017-12-22 DIAGNOSIS — Z79899 Other long term (current) drug therapy: Secondary | ICD-10-CM | POA: Diagnosis not present

## 2017-12-22 DIAGNOSIS — M546 Pain in thoracic spine: Secondary | ICD-10-CM | POA: Diagnosis not present

## 2017-12-22 DIAGNOSIS — R32 Unspecified urinary incontinence: Secondary | ICD-10-CM | POA: Diagnosis not present

## 2017-12-22 DIAGNOSIS — M069 Rheumatoid arthritis, unspecified: Secondary | ICD-10-CM | POA: Diagnosis not present

## 2017-12-22 DIAGNOSIS — F329 Major depressive disorder, single episode, unspecified: Secondary | ICD-10-CM | POA: Diagnosis not present

## 2017-12-26 ENCOUNTER — Encounter: Payer: Medicare Other | Admitting: Surgery

## 2017-12-26 ENCOUNTER — Other Ambulatory Visit: Payer: Medicare Other

## 2017-12-26 DIAGNOSIS — M25562 Pain in left knee: Secondary | ICD-10-CM | POA: Diagnosis not present

## 2017-12-26 DIAGNOSIS — M150 Primary generalized (osteo)arthritis: Secondary | ICD-10-CM | POA: Diagnosis not present

## 2017-12-26 DIAGNOSIS — Z681 Body mass index (BMI) 19 or less, adult: Secondary | ICD-10-CM | POA: Diagnosis not present

## 2017-12-26 DIAGNOSIS — E663 Overweight: Secondary | ICD-10-CM | POA: Diagnosis not present

## 2017-12-26 DIAGNOSIS — M25561 Pain in right knee: Secondary | ICD-10-CM | POA: Diagnosis not present

## 2017-12-26 DIAGNOSIS — M0609 Rheumatoid arthritis without rheumatoid factor, multiple sites: Secondary | ICD-10-CM | POA: Diagnosis not present

## 2017-12-28 DIAGNOSIS — M545 Low back pain: Secondary | ICD-10-CM | POA: Diagnosis not present

## 2017-12-28 DIAGNOSIS — M47817 Spondylosis without myelopathy or radiculopathy, lumbosacral region: Secondary | ICD-10-CM | POA: Diagnosis not present

## 2017-12-28 DIAGNOSIS — S22000A Wedge compression fracture of unspecified thoracic vertebra, initial encounter for closed fracture: Secondary | ICD-10-CM | POA: Diagnosis not present

## 2018-01-05 DIAGNOSIS — H524 Presbyopia: Secondary | ICD-10-CM | POA: Diagnosis not present

## 2018-01-17 DIAGNOSIS — F4542 Pain disorder with related psychological factors: Secondary | ICD-10-CM | POA: Diagnosis not present

## 2018-01-17 DIAGNOSIS — F4321 Adjustment disorder with depressed mood: Secondary | ICD-10-CM | POA: Diagnosis not present

## 2018-01-17 DIAGNOSIS — M47817 Spondylosis without myelopathy or radiculopathy, lumbosacral region: Secondary | ICD-10-CM | POA: Diagnosis not present

## 2018-01-23 DIAGNOSIS — Z6822 Body mass index (BMI) 22.0-22.9, adult: Secondary | ICD-10-CM | POA: Diagnosis not present

## 2018-01-23 DIAGNOSIS — M546 Pain in thoracic spine: Secondary | ICD-10-CM | POA: Diagnosis not present

## 2018-01-23 DIAGNOSIS — I10 Essential (primary) hypertension: Secondary | ICD-10-CM | POA: Diagnosis not present

## 2018-03-06 DIAGNOSIS — E039 Hypothyroidism, unspecified: Secondary | ICD-10-CM | POA: Diagnosis not present

## 2018-03-06 DIAGNOSIS — M546 Pain in thoracic spine: Secondary | ICD-10-CM | POA: Diagnosis not present

## 2018-03-06 DIAGNOSIS — I10 Essential (primary) hypertension: Secondary | ICD-10-CM | POA: Diagnosis not present

## 2018-03-06 DIAGNOSIS — Z79899 Other long term (current) drug therapy: Secondary | ICD-10-CM | POA: Diagnosis not present

## 2018-03-30 DIAGNOSIS — E663 Overweight: Secondary | ICD-10-CM | POA: Diagnosis not present

## 2018-03-30 DIAGNOSIS — R5382 Chronic fatigue, unspecified: Secondary | ICD-10-CM | POA: Diagnosis not present

## 2018-03-30 DIAGNOSIS — Z681 Body mass index (BMI) 19 or less, adult: Secondary | ICD-10-CM | POA: Diagnosis not present

## 2018-03-30 DIAGNOSIS — M150 Primary generalized (osteo)arthritis: Secondary | ICD-10-CM | POA: Diagnosis not present

## 2018-03-30 DIAGNOSIS — M0609 Rheumatoid arthritis without rheumatoid factor, multiple sites: Secondary | ICD-10-CM | POA: Diagnosis not present

## 2018-04-21 DIAGNOSIS — N390 Urinary tract infection, site not specified: Secondary | ICD-10-CM | POA: Diagnosis not present

## 2018-04-21 DIAGNOSIS — N952 Postmenopausal atrophic vaginitis: Secondary | ICD-10-CM | POA: Diagnosis not present

## 2018-04-21 DIAGNOSIS — Z6820 Body mass index (BMI) 20.0-20.9, adult: Secondary | ICD-10-CM | POA: Diagnosis not present

## 2018-06-06 DIAGNOSIS — J309 Allergic rhinitis, unspecified: Secondary | ICD-10-CM | POA: Diagnosis not present

## 2018-06-06 DIAGNOSIS — R05 Cough: Secondary | ICD-10-CM | POA: Diagnosis not present

## 2018-06-13 DIAGNOSIS — Z6820 Body mass index (BMI) 20.0-20.9, adult: Secondary | ICD-10-CM | POA: Diagnosis not present

## 2018-06-13 DIAGNOSIS — R05 Cough: Secondary | ICD-10-CM | POA: Diagnosis not present

## 2018-06-20 DIAGNOSIS — R05 Cough: Secondary | ICD-10-CM | POA: Diagnosis not present

## 2018-06-20 DIAGNOSIS — J019 Acute sinusitis, unspecified: Secondary | ICD-10-CM | POA: Diagnosis not present

## 2018-07-24 DIAGNOSIS — Z6820 Body mass index (BMI) 20.0-20.9, adult: Secondary | ICD-10-CM | POA: Diagnosis not present

## 2018-07-24 DIAGNOSIS — K219 Gastro-esophageal reflux disease without esophagitis: Secondary | ICD-10-CM | POA: Diagnosis not present

## 2018-08-02 DIAGNOSIS — L219 Seborrheic dermatitis, unspecified: Secondary | ICD-10-CM | POA: Diagnosis not present

## 2018-08-02 DIAGNOSIS — I10 Essential (primary) hypertension: Secondary | ICD-10-CM | POA: Diagnosis not present

## 2018-08-02 DIAGNOSIS — R05 Cough: Secondary | ICD-10-CM | POA: Diagnosis not present

## 2018-08-02 DIAGNOSIS — E039 Hypothyroidism, unspecified: Secondary | ICD-10-CM | POA: Diagnosis not present

## 2018-08-02 DIAGNOSIS — K219 Gastro-esophageal reflux disease without esophagitis: Secondary | ICD-10-CM | POA: Diagnosis not present

## 2018-08-14 DIAGNOSIS — M546 Pain in thoracic spine: Secondary | ICD-10-CM | POA: Diagnosis not present

## 2018-08-14 DIAGNOSIS — K219 Gastro-esophageal reflux disease without esophagitis: Secondary | ICD-10-CM | POA: Diagnosis not present

## 2018-08-14 DIAGNOSIS — Z681 Body mass index (BMI) 19 or less, adult: Secondary | ICD-10-CM | POA: Diagnosis not present

## 2019-01-15 DIAGNOSIS — E039 Hypothyroidism, unspecified: Secondary | ICD-10-CM | POA: Diagnosis not present

## 2019-01-15 DIAGNOSIS — K219 Gastro-esophageal reflux disease without esophagitis: Secondary | ICD-10-CM | POA: Diagnosis not present

## 2019-01-15 DIAGNOSIS — R05 Cough: Secondary | ICD-10-CM | POA: Diagnosis not present

## 2019-01-15 DIAGNOSIS — I10 Essential (primary) hypertension: Secondary | ICD-10-CM | POA: Diagnosis not present

## 2019-02-13 DIAGNOSIS — I10 Essential (primary) hypertension: Secondary | ICD-10-CM | POA: Diagnosis not present

## 2019-02-13 DIAGNOSIS — Z9181 History of falling: Secondary | ICD-10-CM | POA: Diagnosis not present

## 2019-02-13 DIAGNOSIS — R51 Headache: Secondary | ICD-10-CM | POA: Diagnosis not present

## 2019-02-13 DIAGNOSIS — H109 Unspecified conjunctivitis: Secondary | ICD-10-CM | POA: Diagnosis not present

## 2019-02-20 DIAGNOSIS — R269 Unspecified abnormalities of gait and mobility: Secondary | ICD-10-CM | POA: Diagnosis not present

## 2019-02-20 DIAGNOSIS — D649 Anemia, unspecified: Secondary | ICD-10-CM | POA: Diagnosis not present

## 2019-02-20 DIAGNOSIS — R2681 Unsteadiness on feet: Secondary | ICD-10-CM | POA: Diagnosis not present

## 2019-02-20 DIAGNOSIS — R51 Headache: Secondary | ICD-10-CM | POA: Diagnosis not present

## 2019-02-20 DIAGNOSIS — R55 Syncope and collapse: Secondary | ICD-10-CM | POA: Diagnosis not present

## 2019-02-20 DIAGNOSIS — I10 Essential (primary) hypertension: Secondary | ICD-10-CM | POA: Diagnosis not present

## 2019-03-27 IMAGING — CT CT CTA ABD/PEL W/CM AND/OR W/O CM
3 of 7 series · 11 of 36 positions shown, 17 images · IV contrast (75CC ISOVUE 370)
Comparison: CTA of the chest, abdomen and pelvis at [place]
[facility] on 01/23/2017

CLINICAL DATA: Known abdominal aortic aneurysm.

EXAM:
CT ANGIOGRAPHY ABDOMEN AND PELVIS WITH CONTRAST AND WITHOUT CONTRAST
TECHNIQUE: Multidetector CT imaging of the abdomen and pelvis was performed
using the standard protocol during bolus administration of
intravenous contrast. Multiplanar reconstructed images and MIPs were
obtained and reviewed to evaluate the vascular anatomy.
CONTRAST:  75 mL Isovue 370 IV
Creatinine was obtained on site at [HOSPITAL] at [REDACTED].
Results: Creatinine 1.1 mg/dL.  Estimated GFR 49 mL/minute.

[Series 4: angio · axial · 0.74mm/px · z∈[-306,-10]mm · 6 of 166 slices shown, 11 images]
[im 24/166  soft-tissue]
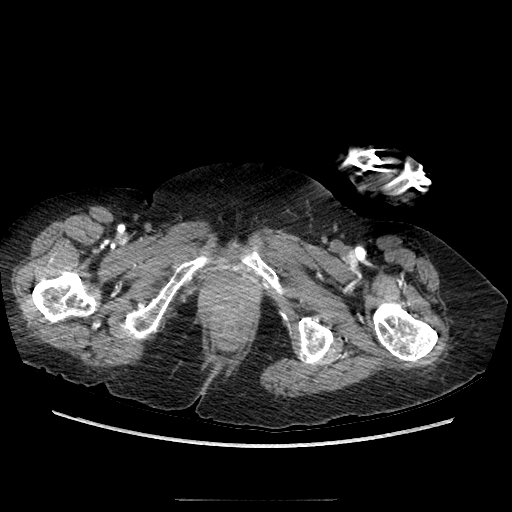
[im 24/166  bone]
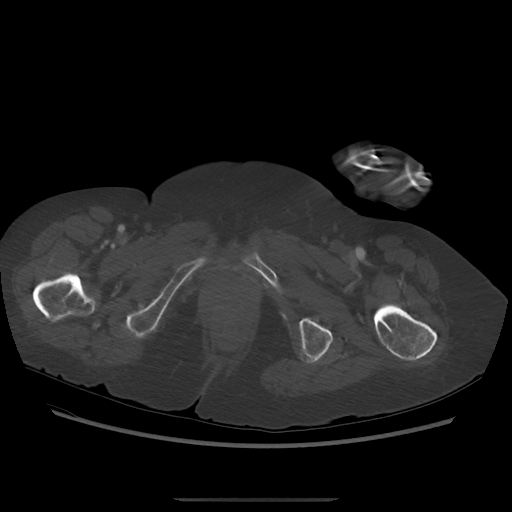
[im 48/166  soft-tissue]
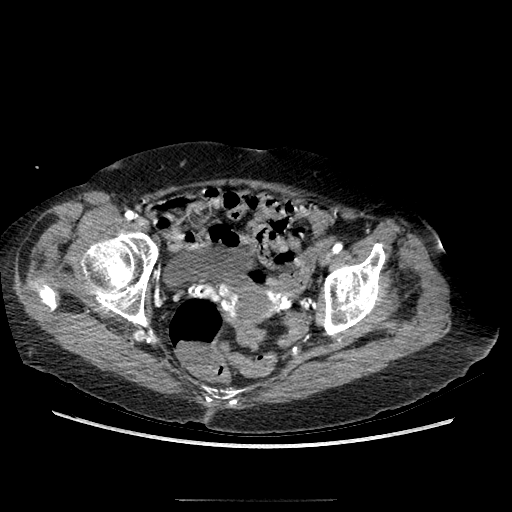
[im 71/166  soft-tissue]
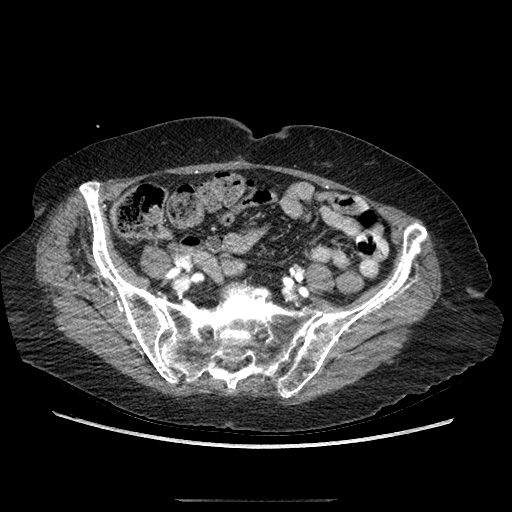
[im 71/166  lung]
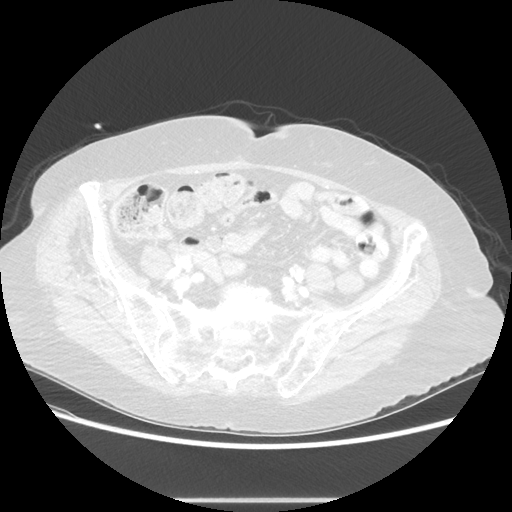
[im 95/166  soft-tissue]
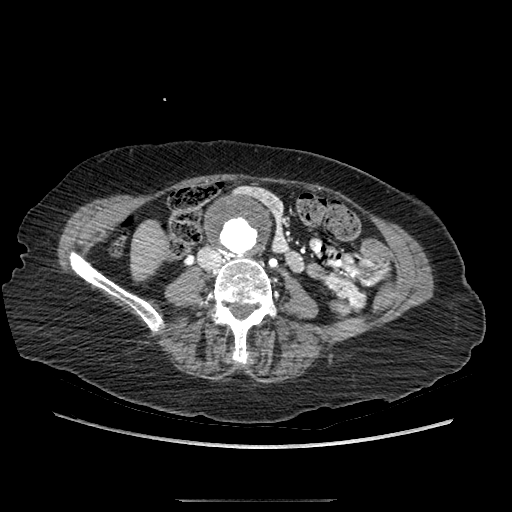
[im 95/166  lung]
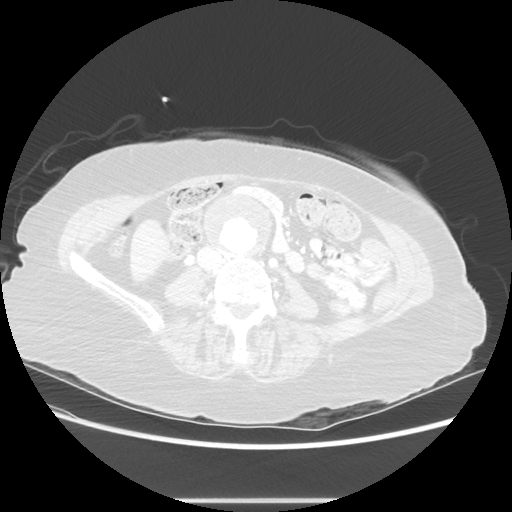
[im 118/166  soft-tissue]
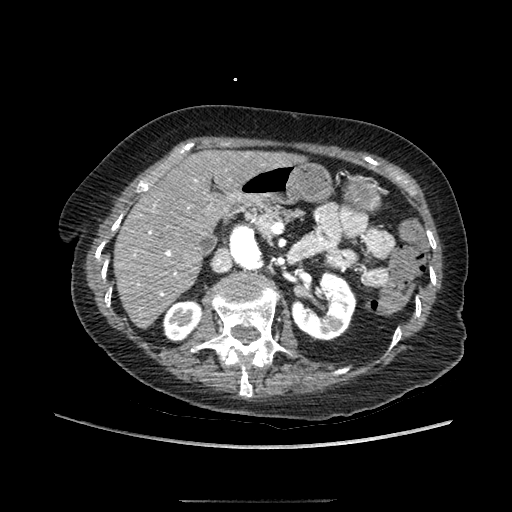
[im 118/166  lung]
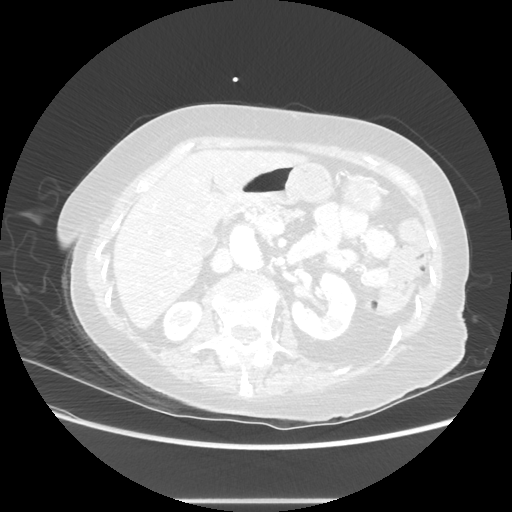
[im 142/166  soft-tissue]
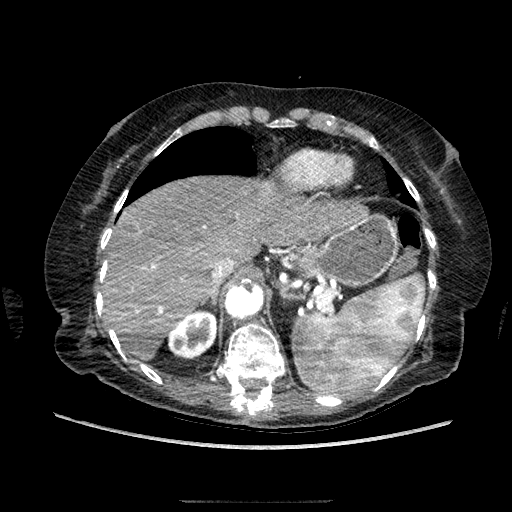
[im 142/166  lung]
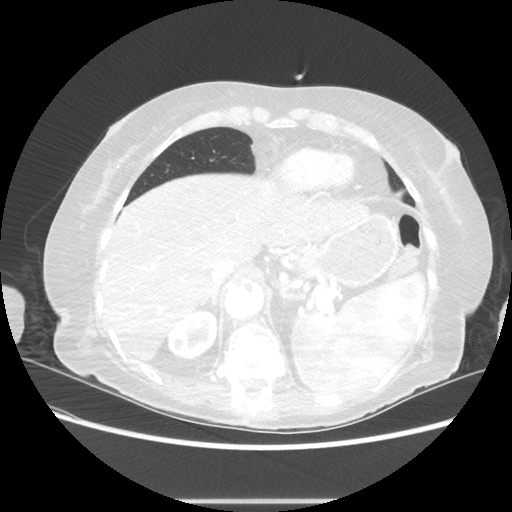

[Series 601: coronal body · coronal · 0.81mm/px · 1 of 107 slices shown, 2 images]
[im 36/107  soft-tissue]
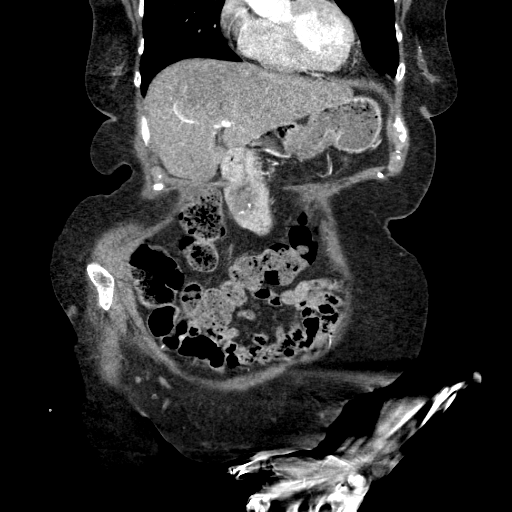
[im 36/107  bone]
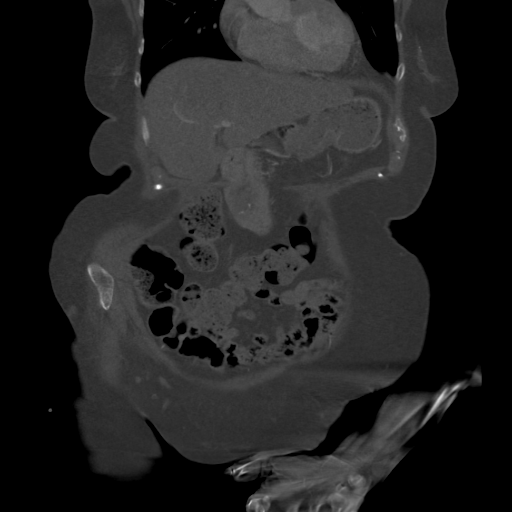

[Series 602: sagittal body · sagittal · 0.81mm/px · 4 of 152 slices shown]
[im 26/152  soft-tissue]
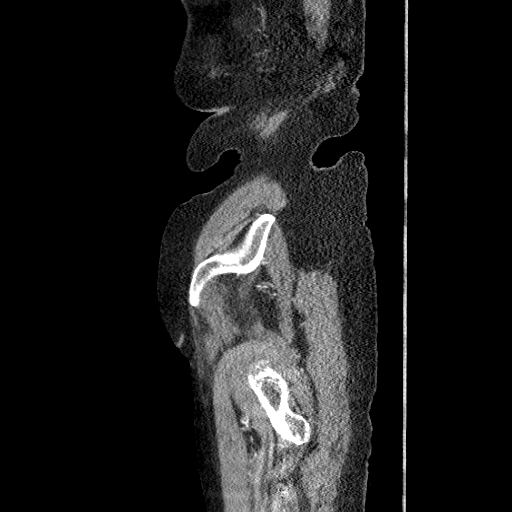
[im 51/152  soft-tissue]
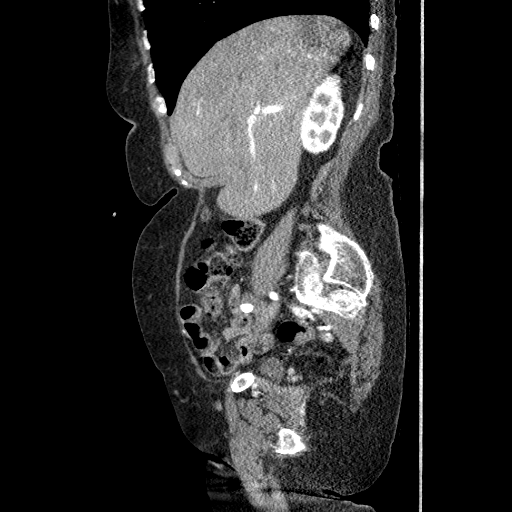
[im 76/152  soft-tissue]
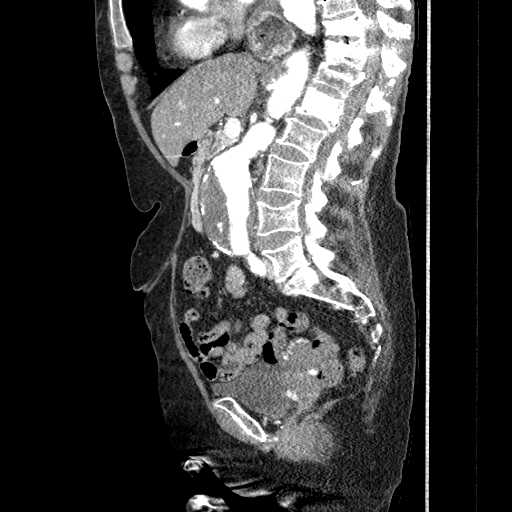
[im 101/152  soft-tissue]
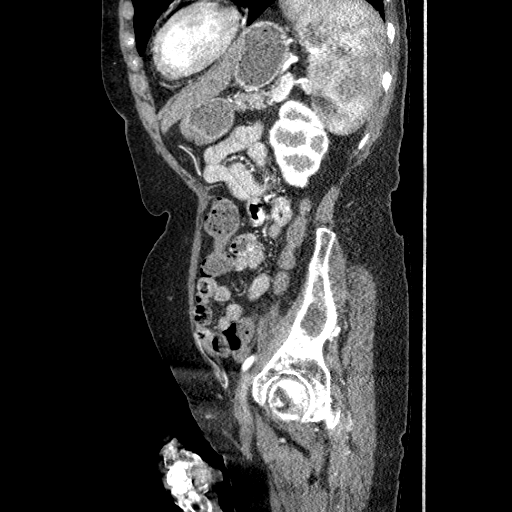

[11 of 36 positions shown; findings below may reference images not displayed]

FINDINGS: VASCULAR

Aorta: AP diameter of the infrarenal abdominal aortic aneurysm is
fairly stable and measures 4.8 cm. The transverse width clearly has
increased in size since prior imaging and now measures 5.1 in
greatest diameter at the level of the iliac crests. Infrarenal neck
is tortuous angulated and measures approximately 2.0 cm in diameter.
No evidence of aneurysm rupture or dissection. Stable mural thrombus
within the aneurysm as well as irregular thrombus in the supraceliac
aortic lumen anteriorly.

Celiac: No significant stenosis.

SMA: No significant stenosis.

Renals: Bilateral single renal arteries show no significant
stenosis.

IMA: Occluded at origin by mural thrombus.

Inflow: Relatively short bilateral common iliac arteries demonstrate
high bifurcations. No evidence of common iliac aneurysms or
stenoses. Bilateral internal and external iliac arteries show normal
patency bilaterally. Femoral bifurcations are both relatively low in
position, located well below the femoral heads at the level of the
lesser trochanters. Both femoral bifurcations appear widely patent.

Review of the MIP images confirms the above findings.

NON-VASCULAR

Lower chest: No acute abnormality.

Hepatobiliary: The liver demonstrates diffuse steatosis. No focal
masses. No evidence of biliary dilatation. The gallbladder is
unremarkable.

Pancreas: Unremarkable. No pancreatic ductal dilatation or
surrounding inflammatory changes.

Spleen: The spleen is moderately enlarged.

Adrenals/Urinary Tract: Adrenal glands are unremarkable. Kidneys are
normal, without renal calculi, focal lesion, or hydronephrosis.
Bladder is unremarkable.

Stomach/Bowel: Bowel shows no evidence of inflammation or
obstruction. No free air.

Lymphatic: No enlarged lymph nodes identified.

Reproductive: Uterus and bilateral adnexa are unremarkable.

Other: No abnormal fluid collections or hernias.

Musculoskeletal: Prior vertebral augmentation at T10, T11 and L1.
Previously visualized posterior fusion hardware at the T11, T12 and
L1 levels have been removed. Moderate degenerative disc disease at
L5-S1.
IMPRESSION: VASCULAR

Interval enlargement in transverse width of the infrarenal abdominal
aortic aneurysm since prior CTA evaluation. Maximal transverse with
has increased from approximately 4.6 cm to 5.1 cm. AP diameter is
relatively stable at 4.8 cm. No evidence of aneurysm rupture.

NON-VASCULAR

Interval removal of posterior lumbar fusion hardware at the T11, T12
and L1 levels with vertebral augmentation at T10, T11 and L1.

## 2019-05-17 DIAGNOSIS — K5900 Constipation, unspecified: Secondary | ICD-10-CM | POA: Diagnosis not present

## 2019-06-01 DIAGNOSIS — R42 Dizziness and giddiness: Secondary | ICD-10-CM | POA: Diagnosis not present

## 2019-06-01 DIAGNOSIS — I10 Essential (primary) hypertension: Secondary | ICD-10-CM | POA: Diagnosis not present

## 2019-06-01 DIAGNOSIS — M546 Pain in thoracic spine: Secondary | ICD-10-CM | POA: Diagnosis not present

## 2019-06-01 DIAGNOSIS — G8929 Other chronic pain: Secondary | ICD-10-CM | POA: Diagnosis not present

## 2019-06-07 DIAGNOSIS — I10 Essential (primary) hypertension: Secondary | ICD-10-CM | POA: Diagnosis not present

## 2019-06-07 DIAGNOSIS — M069 Rheumatoid arthritis, unspecified: Secondary | ICD-10-CM | POA: Diagnosis not present

## 2019-06-07 DIAGNOSIS — E039 Hypothyroidism, unspecified: Secondary | ICD-10-CM | POA: Diagnosis not present

## 2019-06-07 DIAGNOSIS — H8110 Benign paroxysmal vertigo, unspecified ear: Secondary | ICD-10-CM | POA: Diagnosis not present

## 2019-06-21 DIAGNOSIS — D649 Anemia, unspecified: Secondary | ICD-10-CM | POA: Diagnosis not present

## 2019-06-21 DIAGNOSIS — R42 Dizziness and giddiness: Secondary | ICD-10-CM | POA: Diagnosis not present

## 2019-06-21 DIAGNOSIS — J309 Allergic rhinitis, unspecified: Secondary | ICD-10-CM | POA: Diagnosis not present

## 2019-06-21 DIAGNOSIS — Z6821 Body mass index (BMI) 21.0-21.9, adult: Secondary | ICD-10-CM | POA: Diagnosis not present

## 2019-11-13 DIAGNOSIS — K5904 Chronic idiopathic constipation: Secondary | ICD-10-CM | POA: Diagnosis not present

## 2019-12-04 DIAGNOSIS — K5904 Chronic idiopathic constipation: Secondary | ICD-10-CM | POA: Diagnosis not present

## 2019-12-14 DIAGNOSIS — I10 Essential (primary) hypertension: Secondary | ICD-10-CM | POA: Diagnosis not present

## 2019-12-14 DIAGNOSIS — K644 Residual hemorrhoidal skin tags: Secondary | ICD-10-CM | POA: Diagnosis not present

## 2019-12-14 DIAGNOSIS — M546 Pain in thoracic spine: Secondary | ICD-10-CM | POA: Diagnosis not present

## 2019-12-14 DIAGNOSIS — K5909 Other constipation: Secondary | ICD-10-CM | POA: Diagnosis not present

## 2019-12-16 DIAGNOSIS — M533 Sacrococcygeal disorders, not elsewhere classified: Secondary | ICD-10-CM | POA: Diagnosis not present

## 2019-12-16 DIAGNOSIS — R52 Pain, unspecified: Secondary | ICD-10-CM | POA: Diagnosis not present

## 2019-12-16 DIAGNOSIS — S3992XA Unspecified injury of lower back, initial encounter: Secondary | ICD-10-CM | POA: Diagnosis not present

## 2019-12-16 DIAGNOSIS — S299XXA Unspecified injury of thorax, initial encounter: Secondary | ICD-10-CM | POA: Diagnosis not present

## 2019-12-16 DIAGNOSIS — I959 Hypotension, unspecified: Secondary | ICD-10-CM | POA: Diagnosis not present

## 2019-12-16 DIAGNOSIS — R0902 Hypoxemia: Secondary | ICD-10-CM | POA: Diagnosis not present

## 2019-12-16 DIAGNOSIS — W19XXXA Unspecified fall, initial encounter: Secondary | ICD-10-CM | POA: Diagnosis not present

## 2019-12-19 DIAGNOSIS — D61818 Other pancytopenia: Secondary | ICD-10-CM | POA: Diagnosis not present

## 2019-12-20 DIAGNOSIS — R768 Other specified abnormal immunological findings in serum: Secondary | ICD-10-CM | POA: Diagnosis not present

## 2019-12-20 DIAGNOSIS — R410 Disorientation, unspecified: Secondary | ICD-10-CM | POA: Diagnosis not present

## 2019-12-20 DIAGNOSIS — N179 Acute kidney failure, unspecified: Secondary | ICD-10-CM | POA: Diagnosis not present

## 2019-12-20 DIAGNOSIS — K5900 Constipation, unspecified: Secondary | ICD-10-CM | POA: Diagnosis present

## 2019-12-20 DIAGNOSIS — H9190 Unspecified hearing loss, unspecified ear: Secondary | ICD-10-CM | POA: Diagnosis present

## 2019-12-20 DIAGNOSIS — E7800 Pure hypercholesterolemia, unspecified: Secondary | ICD-10-CM | POA: Diagnosis not present

## 2019-12-20 DIAGNOSIS — D591 Autoimmune hemolytic anemia, unspecified: Secondary | ICD-10-CM | POA: Diagnosis not present

## 2019-12-20 DIAGNOSIS — E039 Hypothyroidism, unspecified: Secondary | ICD-10-CM | POA: Diagnosis present

## 2019-12-20 DIAGNOSIS — R188 Other ascites: Secondary | ICD-10-CM | POA: Diagnosis not present

## 2019-12-20 DIAGNOSIS — R52 Pain, unspecified: Secondary | ICD-10-CM | POA: Diagnosis not present

## 2019-12-20 DIAGNOSIS — D696 Thrombocytopenia, unspecified: Secondary | ICD-10-CM | POA: Diagnosis not present

## 2019-12-20 DIAGNOSIS — D62 Acute posthemorrhagic anemia: Secondary | ICD-10-CM | POA: Diagnosis present

## 2019-12-20 DIAGNOSIS — F339 Major depressive disorder, recurrent, unspecified: Secondary | ICD-10-CM | POA: Diagnosis not present

## 2019-12-20 DIAGNOSIS — F4325 Adjustment disorder with mixed disturbance of emotions and conduct: Secondary | ICD-10-CM | POA: Diagnosis present

## 2019-12-20 DIAGNOSIS — Z781 Physical restraint status: Secondary | ICD-10-CM | POA: Diagnosis not present

## 2019-12-20 DIAGNOSIS — I10 Essential (primary) hypertension: Secondary | ICD-10-CM | POA: Diagnosis present

## 2019-12-20 DIAGNOSIS — T82330A Leakage of aortic (bifurcation) graft (replacement), initial encounter: Secondary | ICD-10-CM | POA: Diagnosis present

## 2019-12-20 DIAGNOSIS — D5911 Warm autoimmune hemolytic anemia: Secondary | ICD-10-CM | POA: Diagnosis present

## 2019-12-20 DIAGNOSIS — M069 Rheumatoid arthritis, unspecified: Secondary | ICD-10-CM | POA: Diagnosis present

## 2019-12-20 DIAGNOSIS — R1084 Generalized abdominal pain: Secondary | ICD-10-CM | POA: Diagnosis not present

## 2019-12-20 DIAGNOSIS — Z880 Allergy status to penicillin: Secondary | ICD-10-CM | POA: Diagnosis not present

## 2019-12-20 DIAGNOSIS — I9789 Other postprocedural complications and disorders of the circulatory system, not elsewhere classified: Secondary | ICD-10-CM | POA: Diagnosis not present

## 2019-12-20 DIAGNOSIS — D708 Other neutropenia: Secondary | ICD-10-CM | POA: Diagnosis not present

## 2019-12-20 DIAGNOSIS — R45851 Suicidal ideations: Secondary | ICD-10-CM | POA: Diagnosis present

## 2019-12-20 DIAGNOSIS — E785 Hyperlipidemia, unspecified: Secondary | ICD-10-CM | POA: Diagnosis present

## 2019-12-20 DIAGNOSIS — I959 Hypotension, unspecified: Secondary | ICD-10-CM | POA: Diagnosis not present

## 2019-12-20 DIAGNOSIS — F329 Major depressive disorder, single episode, unspecified: Secondary | ICD-10-CM | POA: Diagnosis present

## 2019-12-20 DIAGNOSIS — Z91040 Latex allergy status: Secondary | ICD-10-CM | POA: Diagnosis not present

## 2019-12-20 DIAGNOSIS — D72810 Lymphocytopenia: Secondary | ICD-10-CM | POA: Diagnosis present

## 2019-12-20 DIAGNOSIS — E538 Deficiency of other specified B group vitamins: Secondary | ICD-10-CM | POA: Diagnosis present

## 2019-12-20 DIAGNOSIS — Z885 Allergy status to narcotic agent status: Secondary | ICD-10-CM | POA: Diagnosis not present

## 2019-12-20 DIAGNOSIS — Z7401 Bed confinement status: Secondary | ICD-10-CM | POA: Diagnosis not present

## 2019-12-20 DIAGNOSIS — E871 Hypo-osmolality and hyponatremia: Secondary | ICD-10-CM | POA: Diagnosis not present

## 2019-12-20 DIAGNOSIS — R0902 Hypoxemia: Secondary | ICD-10-CM | POA: Diagnosis not present

## 2019-12-20 DIAGNOSIS — D61818 Other pancytopenia: Secondary | ICD-10-CM | POA: Diagnosis present

## 2019-12-20 DIAGNOSIS — E861 Hypovolemia: Secondary | ICD-10-CM | POA: Diagnosis present

## 2019-12-20 DIAGNOSIS — D72819 Decreased white blood cell count, unspecified: Secondary | ICD-10-CM | POA: Diagnosis not present

## 2019-12-20 DIAGNOSIS — M2550 Pain in unspecified joint: Secondary | ICD-10-CM | POA: Diagnosis not present

## 2019-12-20 DIAGNOSIS — I714 Abdominal aortic aneurysm, without rupture: Secondary | ICD-10-CM | POA: Diagnosis not present

## 2019-12-20 DIAGNOSIS — R160 Hepatomegaly, not elsewhere classified: Secondary | ICD-10-CM | POA: Diagnosis not present

## 2019-12-20 DIAGNOSIS — D649 Anemia, unspecified: Secondary | ICD-10-CM | POA: Diagnosis not present

## 2019-12-20 DIAGNOSIS — K6289 Other specified diseases of anus and rectum: Secondary | ICD-10-CM | POA: Diagnosis not present

## 2019-12-20 DIAGNOSIS — Z20822 Contact with and (suspected) exposure to covid-19: Secondary | ICD-10-CM | POA: Diagnosis present

## 2019-12-20 DIAGNOSIS — T80319A ABO incompatibility with hemolytic transfusion reaction, unspecified, initial encounter: Secondary | ICD-10-CM | POA: Diagnosis not present

## 2019-12-20 DIAGNOSIS — K219 Gastro-esophageal reflux disease without esophagitis: Secondary | ICD-10-CM | POA: Diagnosis present

## 2019-12-20 DIAGNOSIS — Z881 Allergy status to other antibiotic agents status: Secondary | ICD-10-CM | POA: Diagnosis not present

## 2019-12-20 DIAGNOSIS — R571 Hypovolemic shock: Secondary | ICD-10-CM | POA: Diagnosis present

## 2019-12-20 DIAGNOSIS — R161 Splenomegaly, not elsewhere classified: Secondary | ICD-10-CM | POA: Diagnosis not present

## 2019-12-20 DIAGNOSIS — R109 Unspecified abdominal pain: Secondary | ICD-10-CM | POA: Diagnosis not present

## 2019-12-27 MED ORDER — FOLIC ACID 1 MG PO TABS
1.00 | ORAL_TABLET | ORAL | Status: DC
Start: 2020-01-01 — End: 2019-12-27

## 2019-12-27 MED ORDER — DEXTROMETHORPHAN-GUAIFENESIN 10-100 MG/5ML PO LIQD
5.00 | ORAL | Status: DC
Start: ? — End: 2019-12-27

## 2019-12-27 MED ORDER — ACETAMINOPHEN 325 MG PO TABS
650.00 | ORAL_TABLET | ORAL | Status: DC
Start: ? — End: 2019-12-27

## 2019-12-27 MED ORDER — DIBUCAINE 1 % EX OINT
TOPICAL_OINTMENT | CUTANEOUS | Status: DC
Start: ? — End: 2019-12-27

## 2019-12-27 MED ORDER — GENERIC EXTERNAL MEDICATION
200.00 | Status: DC
Start: 2020-01-01 — End: 2019-12-27

## 2019-12-27 MED ORDER — ONDANSETRON HCL 4 MG PO TABS
4.00 | ORAL_TABLET | ORAL | Status: DC
Start: ? — End: 2019-12-27

## 2019-12-27 MED ORDER — BISACODYL 10 MG RE SUPP
10.00 | RECTAL | Status: DC
Start: ? — End: 2019-12-27

## 2019-12-27 MED ORDER — CALCIUM CARBONATE 1250 (500 CA) MG PO CHEW
500.00 | CHEWABLE_TABLET | ORAL | Status: DC
Start: ? — End: 2019-12-27

## 2019-12-27 MED ORDER — PREDNISONE 10 MG PO TABS
20.00 | ORAL_TABLET | ORAL | Status: DC
Start: 2020-01-01 — End: 2019-12-27

## 2019-12-27 MED ORDER — GENERIC EXTERNAL MEDICATION
25.00 | Status: DC
Start: 2019-12-27 — End: 2019-12-27

## 2019-12-27 MED ORDER — LIDOCAINE 5 % EX OINT
TOPICAL_OINTMENT | CUTANEOUS | Status: DC
Start: ? — End: 2019-12-27

## 2019-12-27 MED ORDER — ONDANSETRON HCL 4 MG/2ML IJ SOLN
4.00 | INTRAMUSCULAR | Status: DC
Start: ? — End: 2019-12-27

## 2019-12-27 MED ORDER — PANTOPRAZOLE SODIUM 40 MG PO TBEC
40.00 | DELAYED_RELEASE_TABLET | ORAL | Status: DC
Start: 2020-01-01 — End: 2019-12-27

## 2019-12-27 MED ORDER — GENERIC EXTERNAL MEDICATION
Status: DC
Start: ? — End: 2019-12-27

## 2019-12-31 ENCOUNTER — Telehealth: Payer: Self-pay | Admitting: Physician Assistant

## 2019-12-31 DIAGNOSIS — Z981 Arthrodesis status: Secondary | ICD-10-CM | POA: Diagnosis not present

## 2019-12-31 DIAGNOSIS — Z85828 Personal history of other malignant neoplasm of skin: Secondary | ICD-10-CM | POA: Diagnosis not present

## 2019-12-31 DIAGNOSIS — D649 Anemia, unspecified: Secondary | ICD-10-CM | POA: Diagnosis not present

## 2019-12-31 DIAGNOSIS — E785 Hyperlipidemia, unspecified: Secondary | ICD-10-CM | POA: Diagnosis not present

## 2019-12-31 DIAGNOSIS — T82538A Leakage of other cardiac and vascular devices and implants, initial encounter: Secondary | ICD-10-CM | POA: Diagnosis not present

## 2019-12-31 DIAGNOSIS — D599 Acquired hemolytic anemia, unspecified: Secondary | ICD-10-CM | POA: Diagnosis not present

## 2019-12-31 DIAGNOSIS — R109 Unspecified abdominal pain: Secondary | ICD-10-CM | POA: Diagnosis not present

## 2019-12-31 DIAGNOSIS — K5900 Constipation, unspecified: Secondary | ICD-10-CM | POA: Diagnosis not present

## 2019-12-31 DIAGNOSIS — Y828 Other medical devices associated with adverse incidents: Secondary | ICD-10-CM | POA: Diagnosis present

## 2019-12-31 DIAGNOSIS — D591 Autoimmune hemolytic anemia, unspecified: Secondary | ICD-10-CM | POA: Diagnosis not present

## 2019-12-31 DIAGNOSIS — Z20828 Contact with and (suspected) exposure to other viral communicable diseases: Secondary | ICD-10-CM | POA: Diagnosis not present

## 2019-12-31 DIAGNOSIS — F329 Major depressive disorder, single episode, unspecified: Secondary | ICD-10-CM | POA: Diagnosis not present

## 2019-12-31 DIAGNOSIS — R7889 Finding of other specified substances, not normally found in blood: Secondary | ICD-10-CM | POA: Diagnosis not present

## 2019-12-31 DIAGNOSIS — K6289 Other specified diseases of anus and rectum: Secondary | ICD-10-CM | POA: Diagnosis not present

## 2019-12-31 DIAGNOSIS — Z20822 Contact with and (suspected) exposure to covid-19: Secondary | ICD-10-CM | POA: Diagnosis not present

## 2019-12-31 DIAGNOSIS — Z809 Family history of malignant neoplasm, unspecified: Secondary | ICD-10-CM | POA: Diagnosis not present

## 2019-12-31 DIAGNOSIS — J9811 Atelectasis: Secondary | ICD-10-CM | POA: Diagnosis not present

## 2019-12-31 DIAGNOSIS — R161 Splenomegaly, not elsewhere classified: Secondary | ICD-10-CM | POA: Diagnosis not present

## 2019-12-31 DIAGNOSIS — F0390 Unspecified dementia without behavioral disturbance: Secondary | ICD-10-CM | POA: Diagnosis not present

## 2019-12-31 DIAGNOSIS — Z882 Allergy status to sulfonamides status: Secondary | ICD-10-CM | POA: Diagnosis not present

## 2019-12-31 DIAGNOSIS — Z880 Allergy status to penicillin: Secondary | ICD-10-CM | POA: Diagnosis not present

## 2019-12-31 DIAGNOSIS — R0902 Hypoxemia: Secondary | ICD-10-CM | POA: Diagnosis not present

## 2019-12-31 DIAGNOSIS — M2550 Pain in unspecified joint: Secondary | ICD-10-CM | POA: Diagnosis not present

## 2019-12-31 DIAGNOSIS — I714 Abdominal aortic aneurysm, without rupture: Secondary | ICD-10-CM | POA: Diagnosis not present

## 2019-12-31 DIAGNOSIS — C8580 Other specified types of non-Hodgkin lymphoma, unspecified site: Secondary | ICD-10-CM | POA: Diagnosis not present

## 2019-12-31 DIAGNOSIS — Z79890 Hormone replacement therapy (postmenopausal): Secondary | ICD-10-CM | POA: Diagnosis not present

## 2019-12-31 DIAGNOSIS — M069 Rheumatoid arthritis, unspecified: Secondary | ICD-10-CM | POA: Diagnosis not present

## 2019-12-31 DIAGNOSIS — K219 Gastro-esophageal reflux disease without esophagitis: Secondary | ICD-10-CM | POA: Diagnosis not present

## 2019-12-31 DIAGNOSIS — Z7401 Bed confinement status: Secondary | ICD-10-CM | POA: Diagnosis not present

## 2019-12-31 DIAGNOSIS — R410 Disorientation, unspecified: Secondary | ICD-10-CM | POA: Diagnosis not present

## 2019-12-31 DIAGNOSIS — Z8249 Family history of ischemic heart disease and other diseases of the circulatory system: Secondary | ICD-10-CM | POA: Diagnosis not present

## 2019-12-31 DIAGNOSIS — Z885 Allergy status to narcotic agent status: Secondary | ICD-10-CM | POA: Diagnosis not present

## 2019-12-31 DIAGNOSIS — I10 Essential (primary) hypertension: Secondary | ICD-10-CM | POA: Diagnosis not present

## 2019-12-31 DIAGNOSIS — R531 Weakness: Secondary | ICD-10-CM | POA: Diagnosis not present

## 2019-12-31 DIAGNOSIS — E079 Disorder of thyroid, unspecified: Secondary | ICD-10-CM | POA: Diagnosis not present

## 2019-12-31 DIAGNOSIS — Z03818 Encounter for observation for suspected exposure to other biological agents ruled out: Secondary | ICD-10-CM | POA: Diagnosis not present

## 2019-12-31 DIAGNOSIS — Z91040 Latex allergy status: Secondary | ICD-10-CM | POA: Diagnosis not present

## 2019-12-31 DIAGNOSIS — Z791 Long term (current) use of non-steroidal anti-inflammatories (NSAID): Secondary | ICD-10-CM | POA: Diagnosis not present

## 2019-12-31 DIAGNOSIS — D5911 Warm autoimmune hemolytic anemia: Secondary | ICD-10-CM | POA: Diagnosis not present

## 2019-12-31 DIAGNOSIS — F17210 Nicotine dependence, cigarettes, uncomplicated: Secondary | ICD-10-CM | POA: Diagnosis not present

## 2019-12-31 DIAGNOSIS — R69 Illness, unspecified: Secondary | ICD-10-CM | POA: Diagnosis not present

## 2019-12-31 DIAGNOSIS — Z7982 Long term (current) use of aspirin: Secondary | ICD-10-CM | POA: Diagnosis not present

## 2019-12-31 DIAGNOSIS — R5381 Other malaise: Secondary | ICD-10-CM | POA: Diagnosis not present

## 2019-12-31 DIAGNOSIS — Z87891 Personal history of nicotine dependence: Secondary | ICD-10-CM | POA: Diagnosis not present

## 2019-12-31 DIAGNOSIS — Z95828 Presence of other vascular implants and grafts: Secondary | ICD-10-CM | POA: Diagnosis not present

## 2019-12-31 DIAGNOSIS — E039 Hypothyroidism, unspecified: Secondary | ICD-10-CM | POA: Diagnosis not present

## 2019-12-31 DIAGNOSIS — D61818 Other pancytopenia: Secondary | ICD-10-CM | POA: Diagnosis not present

## 2019-12-31 DIAGNOSIS — Z66 Do not resuscitate: Secondary | ICD-10-CM | POA: Diagnosis not present

## 2019-12-31 DIAGNOSIS — D589 Hereditary hemolytic anemia, unspecified: Secondary | ICD-10-CM | POA: Diagnosis not present

## 2019-12-31 DIAGNOSIS — N179 Acute kidney failure, unspecified: Secondary | ICD-10-CM | POA: Diagnosis not present

## 2019-12-31 DIAGNOSIS — F0391 Unspecified dementia with behavioral disturbance: Secondary | ICD-10-CM | POA: Diagnosis not present

## 2019-12-31 DIAGNOSIS — C8300 Small cell B-cell lymphoma, unspecified site: Secondary | ICD-10-CM | POA: Diagnosis not present

## 2019-12-31 DIAGNOSIS — Z79899 Other long term (current) drug therapy: Secondary | ICD-10-CM | POA: Diagnosis not present

## 2019-12-31 MED ORDER — LIDOCAINE 5 % EX OINT
TOPICAL_OINTMENT | CUTANEOUS | Status: DC
Start: ? — End: 2019-12-31

## 2019-12-31 MED ORDER — SULFAMETHOXAZOLE-TRIMETHOPRIM 800-160 MG PO TABS
1.00 | ORAL_TABLET | ORAL | Status: DC
Start: 2020-01-02 — End: 2019-12-31

## 2019-12-31 MED ORDER — MELATONIN 3 MG PO TABS
6.00 | ORAL_TABLET | ORAL | Status: DC
Start: ? — End: 2019-12-31

## 2019-12-31 MED ORDER — GENERIC EXTERNAL MEDICATION
Status: DC
Start: ? — End: 2019-12-31

## 2019-12-31 MED ORDER — HEPARIN SODIUM (PORCINE) 5000 UNIT/ML IJ SOLN
5000.00 | INTRAMUSCULAR | Status: DC
Start: 2019-12-31 — End: 2019-12-31

## 2019-12-31 MED ORDER — LOSARTAN POTASSIUM 25 MG PO TABS
25.00 | ORAL_TABLET | ORAL | Status: DC
Start: 2020-01-01 — End: 2019-12-31

## 2019-12-31 MED ORDER — AMLODIPINE BESYLATE 5 MG PO TABS
5.00 | ORAL_TABLET | ORAL | Status: DC
Start: 2020-01-01 — End: 2019-12-31

## 2019-12-31 MED ORDER — DSS 100 MG PO CAPS
100.00 | ORAL_CAPSULE | ORAL | Status: DC
Start: 2019-12-31 — End: 2019-12-31

## 2019-12-31 NOTE — Telephone Encounter (Signed)
Scheduled per referral. Called and spoke with Adonis Huguenin at [facility] [phone]), confirmed 3/18 appt

## 2020-01-02 MED ORDER — GENERIC EXTERNAL MEDICATION
Status: DC
Start: ? — End: 2020-01-02

## 2020-01-03 ENCOUNTER — Other Ambulatory Visit: Payer: Self-pay

## 2020-01-03 ENCOUNTER — Other Ambulatory Visit: Payer: Self-pay | Admitting: Hematology and Oncology

## 2020-01-03 ENCOUNTER — Inpatient Hospital Stay: Payer: No Typology Code available for payment source

## 2020-01-03 ENCOUNTER — Other Ambulatory Visit: Payer: Medicare Other

## 2020-01-03 ENCOUNTER — Other Ambulatory Visit: Payer: Self-pay | Admitting: Physician Assistant

## 2020-01-03 ENCOUNTER — Inpatient Hospital Stay: Payer: No Typology Code available for payment source | Attending: Physician Assistant | Admitting: Physician Assistant

## 2020-01-03 VITALS — BP 126/79 | HR 79 | Temp 98.7°F | Resp 17 | Ht 61.0 in | Wt 96.0 lb

## 2020-01-03 DIAGNOSIS — D5911 Warm autoimmune hemolytic anemia: Secondary | ICD-10-CM | POA: Insufficient documentation

## 2020-01-03 DIAGNOSIS — D591 Autoimmune hemolytic anemia, unspecified: Secondary | ICD-10-CM

## 2020-01-03 DIAGNOSIS — K5900 Constipation, unspecified: Secondary | ICD-10-CM | POA: Diagnosis not present

## 2020-01-03 DIAGNOSIS — K6289 Other specified diseases of anus and rectum: Secondary | ICD-10-CM | POA: Diagnosis not present

## 2020-01-03 DIAGNOSIS — E039 Hypothyroidism, unspecified: Secondary | ICD-10-CM | POA: Diagnosis not present

## 2020-01-03 DIAGNOSIS — Z87891 Personal history of nicotine dependence: Secondary | ICD-10-CM | POA: Insufficient documentation

## 2020-01-03 DIAGNOSIS — E785 Hyperlipidemia, unspecified: Secondary | ICD-10-CM | POA: Insufficient documentation

## 2020-01-03 DIAGNOSIS — Z7982 Long term (current) use of aspirin: Secondary | ICD-10-CM | POA: Insufficient documentation

## 2020-01-03 DIAGNOSIS — R109 Unspecified abdominal pain: Secondary | ICD-10-CM | POA: Diagnosis not present

## 2020-01-03 DIAGNOSIS — M069 Rheumatoid arthritis, unspecified: Secondary | ICD-10-CM | POA: Insufficient documentation

## 2020-01-03 DIAGNOSIS — I10 Essential (primary) hypertension: Secondary | ICD-10-CM | POA: Diagnosis not present

## 2020-01-03 DIAGNOSIS — Z85828 Personal history of other malignant neoplasm of skin: Secondary | ICD-10-CM | POA: Insufficient documentation

## 2020-01-03 DIAGNOSIS — Z79899 Other long term (current) drug therapy: Secondary | ICD-10-CM | POA: Diagnosis not present

## 2020-01-03 DIAGNOSIS — E079 Disorder of thyroid, unspecified: Secondary | ICD-10-CM | POA: Diagnosis not present

## 2020-01-03 DIAGNOSIS — D5919 Other autoimmune hemolytic anemia: Secondary | ICD-10-CM

## 2020-01-03 DIAGNOSIS — F17210 Nicotine dependence, cigarettes, uncomplicated: Secondary | ICD-10-CM | POA: Diagnosis not present

## 2020-01-03 DIAGNOSIS — K219 Gastro-esophageal reflux disease without esophagitis: Secondary | ICD-10-CM | POA: Insufficient documentation

## 2020-01-03 DIAGNOSIS — R161 Splenomegaly, not elsewhere classified: Secondary | ICD-10-CM | POA: Insufficient documentation

## 2020-01-03 DIAGNOSIS — I714 Abdominal aortic aneurysm, without rupture: Secondary | ICD-10-CM | POA: Insufficient documentation

## 2020-01-03 LAB — RETIC PANEL
Immature Retic Fract: 3.4 % (ref 2.3–15.9)
RBC.: 3.54 MIL/uL — ABNORMAL LOW (ref 3.87–5.11)
Retic Count, Absolute: 43.9 10*3/uL (ref 19.0–186.0)
Retic Ct Pct: 1.2 % (ref 0.4–3.1)
Reticulocyte Hemoglobin: 37.4 pg (ref >27.9)

## 2020-01-03 LAB — CMP (CANCER CENTER ONLY)
ALT: 11 U/L (ref 0–44)
AST: 22 U/L (ref 15–41)
Albumin: 3.9 g/dL (ref 3.5–5.0)
Alkaline Phosphatase: 103 U/L (ref 38–126)
Anion gap: 10 (ref 5–15)
BUN: 35 mg/dL — ABNORMAL HIGH (ref 8–23)
CO2: 25 mmol/L (ref 22–32)
Calcium: 8.7 mg/dL — ABNORMAL LOW (ref 8.9–10.3)
Chloride: 102 mmol/L (ref 98–111)
Creatinine: 1.14 mg/dL — ABNORMAL HIGH (ref 0.44–1.00)
GFR, Est AFR Am: 54 mL/min — ABNORMAL LOW (ref >60)
GFR, Estimated: 46 mL/min — ABNORMAL LOW (ref >60)
Glucose, Bld: 85 mg/dL (ref 70–99)
Potassium: 4.3 mmol/L (ref 3.5–5.1)
Sodium: 137 mmol/L (ref 135–145)
Total Bilirubin: 1.1 mg/dL (ref 0.3–1.2)
Total Protein: 6.2 g/dL — ABNORMAL LOW (ref 6.5–8.1)

## 2020-01-03 LAB — DIRECT ANTIGLOBULIN TEST (NOT AT ARMC)
DAT, IgG: NEGATIVE
DAT, complement: NEGATIVE

## 2020-01-03 LAB — HEPATITIS B SURFACE ANTIBODY,QUALITATIVE: Hep B S Ab: NONREACTIVE

## 2020-01-03 LAB — CBC WITH DIFFERENTIAL (CANCER CENTER ONLY)
Abs Immature Granulocytes: 0.03 10*3/uL (ref 0.00–0.07)
Basophils Absolute: 0 10*3/uL (ref 0.0–0.1)
Basophils Relative: 0 %
Eosinophils Absolute: 0.1 10*3/uL (ref 0.0–0.5)
Eosinophils Relative: 1 %
HCT: 35.8 % — ABNORMAL LOW (ref 36.0–46.0)
Hemoglobin: 11.7 g/dL — ABNORMAL LOW (ref 12.0–15.0)
Immature Granulocytes: 0 %
Lymphocytes Relative: 19 %
Lymphs Abs: 1.3 10*3/uL (ref 0.7–4.0)
MCH: 32.7 pg (ref 26.0–34.0)
MCHC: 32.7 g/dL (ref 30.0–36.0)
MCV: 100 fL (ref 80.0–100.0)
Monocytes Absolute: 0.6 10*3/uL (ref 0.1–1.0)
Monocytes Relative: 8 %
Neutro Abs: 5.1 10*3/uL (ref 1.7–7.7)
Neutrophils Relative %: 72 %
Platelet Count: 119 10*3/uL — ABNORMAL LOW (ref 150–400)
RBC: 3.58 MIL/uL — ABNORMAL LOW (ref 3.87–5.11)
RDW: 17.2 % — ABNORMAL HIGH (ref 11.5–15.5)
WBC Count: 7.1 10*3/uL (ref 4.0–10.5)
nRBC: 0 % (ref 0.0–0.2)

## 2020-01-03 LAB — LACTATE DEHYDROGENASE: LDH: 576 U/L — ABNORMAL HIGH (ref 98–192)

## 2020-01-03 LAB — SAVE SMEAR(SSMR), FOR PROVIDER SLIDE REVIEW

## 2020-01-03 LAB — HEPATITIS B CORE ANTIBODY, TOTAL: Hep B Core Total Ab: NONREACTIVE

## 2020-01-03 LAB — HEPATITIS B SURFACE ANTIGEN: Hepatitis B Surface Ag: NONREACTIVE
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
## 2020-01-04 LAB — HAPTOGLOBIN: Haptoglobin: <10 mg/dL — ABNORMAL LOW (ref 42–346)

## 2020-01-09 DIAGNOSIS — I714 Abdominal aortic aneurysm, without rupture: Secondary | ICD-10-CM | POA: Diagnosis not present

## 2020-01-09 DIAGNOSIS — I10 Essential (primary) hypertension: Secondary | ICD-10-CM | POA: Diagnosis not present

## 2020-01-09 DIAGNOSIS — E785 Hyperlipidemia, unspecified: Secondary | ICD-10-CM | POA: Diagnosis not present

## 2020-01-09 DIAGNOSIS — D589 Hereditary hemolytic anemia, unspecified: Secondary | ICD-10-CM | POA: Diagnosis not present

## 2020-01-10 ENCOUNTER — Telehealth: Payer: Self-pay

## 2020-01-10 LAB — MISC LABCORP TEST (SEND OUT): Labcorp test code: 115005

## 2020-01-10 NOTE — Telephone Encounter (Signed)
Received call from [facility] in [employer]. Patient's bone marrow biopsy scheduled on 01/16/20 at 11am.  Called and informed son Vernette Moise, who is patient's POA about appointment date and time.  Called and spoke to [place] at [place] [phone]), where patient is currently residing.  Information for appointment given to [facility] (Arrival time of 9:15am, patient needs to be NPO at midnight, patient is to not take aspirin on 01/14/20 and 01/15/20, patient is needing driver, and patient needs someone to stay with her the evening of the procedure day). [place] to coordinate patient transportation for the appointment.

## 2020-01-13 ENCOUNTER — Other Ambulatory Visit: Payer: Self-pay | Admitting: Radiology

## 2020-01-14 ENCOUNTER — Telehealth: Payer: Self-pay | Admitting: Physician Assistant

## 2020-01-14 NOTE — Telephone Encounter (Signed)
Scheduled appt per 3/26 sch msg. [place] where pt currently resides. Left a voicemail with the person that handles appointments with new appt details.

## 2020-01-15 ENCOUNTER — Ambulatory Visit (HOSPITAL_COMMUNITY)
Admission: RE | Admit: 2020-01-15 | Discharge: 2020-01-15 | Disposition: A | Discharge status: Home / Self Care | Payer: No Typology Code available for payment source | Source: Ambulatory Visit | Attending: Physician Assistant | Admitting: Physician Assistant

## 2020-01-15 ENCOUNTER — Inpatient Hospital Stay (HOSPITAL_COMMUNITY)
Admission: EM | Admit: 2020-01-15 | Discharge: 2020-01-17 | DRG: 809 | Disposition: A | Discharge status: Skilled Nursing Facility | Payer: No Typology Code available for payment source | Attending: Internal Medicine | Admitting: Internal Medicine

## 2020-01-15 ENCOUNTER — Encounter (HOSPITAL_COMMUNITY): Payer: Self-pay | Admitting: Radiology

## 2020-01-15 ENCOUNTER — Other Ambulatory Visit: Payer: Self-pay

## 2020-01-15 DIAGNOSIS — I10 Essential (primary) hypertension: Secondary | ICD-10-CM | POA: Diagnosis present

## 2020-01-15 DIAGNOSIS — T82538A Leakage of other cardiac and vascular devices and implants, initial encounter: Secondary | ICD-10-CM | POA: Diagnosis present

## 2020-01-15 DIAGNOSIS — Z8521 Personal history of malignant neoplasm of larynx: Secondary | ICD-10-CM

## 2020-01-15 DIAGNOSIS — Z66 Do not resuscitate: Secondary | ICD-10-CM | POA: Diagnosis present

## 2020-01-15 DIAGNOSIS — Z809 Family history of malignant neoplasm, unspecified: Secondary | ICD-10-CM

## 2020-01-15 DIAGNOSIS — Z87891 Personal history of nicotine dependence: Secondary | ICD-10-CM

## 2020-01-15 DIAGNOSIS — Z85828 Personal history of other malignant neoplasm of skin: Secondary | ICD-10-CM

## 2020-01-15 DIAGNOSIS — R490 Dysphonia: Secondary | ICD-10-CM | POA: Diagnosis present

## 2020-01-15 DIAGNOSIS — Z791 Long term (current) use of non-steroidal anti-inflammatories (NSAID): Secondary | ICD-10-CM

## 2020-01-15 DIAGNOSIS — Z95828 Presence of other vascular implants and grafts: Secondary | ICD-10-CM

## 2020-01-15 DIAGNOSIS — Z79890 Hormone replacement therapy (postmenopausal): Secondary | ICD-10-CM

## 2020-01-15 DIAGNOSIS — Z882 Allergy status to sulfonamides status: Secondary | ICD-10-CM

## 2020-01-15 DIAGNOSIS — D649 Anemia, unspecified: Secondary | ICD-10-CM | POA: Diagnosis not present

## 2020-01-15 DIAGNOSIS — D599 Acquired hemolytic anemia, unspecified: Secondary | ICD-10-CM | POA: Diagnosis not present

## 2020-01-15 DIAGNOSIS — E785 Hyperlipidemia, unspecified: Secondary | ICD-10-CM | POA: Diagnosis present

## 2020-01-15 DIAGNOSIS — H9190 Unspecified hearing loss, unspecified ear: Secondary | ICD-10-CM | POA: Diagnosis present

## 2020-01-15 DIAGNOSIS — Z91040 Latex allergy status: Secondary | ICD-10-CM

## 2020-01-15 DIAGNOSIS — E039 Hypothyroidism, unspecified: Secondary | ICD-10-CM | POA: Diagnosis present

## 2020-01-15 DIAGNOSIS — D589 Hereditary hemolytic anemia, unspecified: Secondary | ICD-10-CM | POA: Diagnosis present

## 2020-01-15 DIAGNOSIS — F03918 Unspecified dementia, unspecified severity, with other behavioral disturbance: Secondary | ICD-10-CM | POA: Diagnosis present

## 2020-01-15 DIAGNOSIS — Z981 Arthrodesis status: Secondary | ICD-10-CM

## 2020-01-15 DIAGNOSIS — Z20822 Contact with and (suspected) exposure to covid-19: Secondary | ICD-10-CM | POA: Diagnosis present

## 2020-01-15 DIAGNOSIS — D61818 Other pancytopenia: Secondary | ICD-10-CM | POA: Diagnosis not present

## 2020-01-15 DIAGNOSIS — E861 Hypovolemia: Secondary | ICD-10-CM | POA: Diagnosis present

## 2020-01-15 DIAGNOSIS — N179 Acute kidney failure, unspecified: Secondary | ICD-10-CM | POA: Diagnosis present

## 2020-01-15 DIAGNOSIS — I714 Abdominal aortic aneurysm, without rupture, unspecified: Secondary | ICD-10-CM | POA: Diagnosis present

## 2020-01-15 DIAGNOSIS — M069 Rheumatoid arthritis, unspecified: Secondary | ICD-10-CM | POA: Diagnosis present

## 2020-01-15 DIAGNOSIS — Z03818 Encounter for observation for suspected exposure to other biological agents ruled out: Secondary | ICD-10-CM | POA: Diagnosis not present

## 2020-01-15 DIAGNOSIS — F0391 Unspecified dementia with behavioral disturbance: Secondary | ICD-10-CM | POA: Diagnosis present

## 2020-01-15 DIAGNOSIS — Z7982 Long term (current) use of aspirin: Secondary | ICD-10-CM

## 2020-01-15 DIAGNOSIS — D591 Autoimmune hemolytic anemia, unspecified: Secondary | ICD-10-CM

## 2020-01-15 DIAGNOSIS — Y828 Other medical devices associated with adverse incidents: Secondary | ICD-10-CM | POA: Diagnosis present

## 2020-01-15 DIAGNOSIS — Z993 Dependence on wheelchair: Secondary | ICD-10-CM

## 2020-01-15 DIAGNOSIS — Z885 Allergy status to narcotic agent status: Secondary | ICD-10-CM

## 2020-01-15 DIAGNOSIS — Z8249 Family history of ischemic heart disease and other diseases of the circulatory system: Secondary | ICD-10-CM

## 2020-01-15 DIAGNOSIS — Z880 Allergy status to penicillin: Secondary | ICD-10-CM

## 2020-01-15 LAB — CBC WITH DIFFERENTIAL/PLATELET
Abs Immature Granulocytes: 0.02 10*3/uL (ref 0.00–0.07)
Basophils Absolute: 0 10*3/uL (ref 0.0–0.1)
Basophils Relative: 0 %
Eosinophils Absolute: 0 10*3/uL (ref 0.0–0.5)
Eosinophils Relative: 1 %
HCT: 18.8 % — ABNORMAL LOW (ref 36.0–46.0)
Hemoglobin: 5.9 g/dL — CL (ref 12.0–15.0)
Immature Granulocytes: 1 %
Lymphocytes Relative: 18 %
Lymphs Abs: 0.6 10*3/uL — ABNORMAL LOW (ref 0.7–4.0)
MCH: 32.8 pg (ref 26.0–34.0)
MCHC: 31.4 g/dL (ref 30.0–36.0)
MCV: 104.4 fL — ABNORMAL HIGH (ref 80.0–100.0)
Monocytes Absolute: 0.3 10*3/uL (ref 0.1–1.0)
Monocytes Relative: 9 %
Neutro Abs: 2.4 10*3/uL (ref 1.7–7.7)
Neutrophils Relative %: 71 %
Platelets: 136 10*3/uL — ABNORMAL LOW (ref 150–400)
RBC: 1.8 MIL/uL — ABNORMAL LOW (ref 3.87–5.11)
RDW: 18.7 % — ABNORMAL HIGH (ref 11.5–15.5)
WBC: 3.3 10*3/uL — ABNORMAL LOW (ref 4.0–10.5)
nRBC: 0 % (ref 0.0–0.2)

## 2020-01-15 LAB — BASIC METABOLIC PANEL
Anion gap: 7 (ref 5–15)
BUN: 53 mg/dL — ABNORMAL HIGH (ref 8–23)
CO2: 26 mmol/L (ref 22–32)
Calcium: 8.2 mg/dL — ABNORMAL LOW (ref 8.9–10.3)
Chloride: 102 mmol/L (ref 98–111)
Creatinine, Ser: 2.15 mg/dL — ABNORMAL HIGH (ref 0.44–1.00)
GFR calc Af Amer: 25 mL/min — ABNORMAL LOW (ref >60)
GFR calc non Af Amer: 21 mL/min — ABNORMAL LOW (ref >60)
Glucose, Bld: 95 mg/dL (ref 70–99)
Potassium: 4.7 mmol/L (ref 3.5–5.1)
Sodium: 135 mmol/L (ref 135–145)

## 2020-01-15 LAB — PROTIME-INR
INR: 1 (ref 0.8–1.2)
Prothrombin Time: 13 seconds (ref 11.4–15.2)

## 2020-01-15 LAB — PREPARE RBC (CROSSMATCH)

## 2020-01-15 LAB — POC OCCULT BLOOD, ED: Fecal Occult Bld: NEGATIVE

## 2020-01-15 LAB — TSH: TSH: 8.822 u[IU]/mL — ABNORMAL HIGH (ref 0.350–4.500)

## 2020-01-15 LAB — SARS CORONAVIRUS 2 (TAT 6-24 HRS): SARS Coronavirus 2: NEGATIVE

## 2020-01-15 MED ORDER — ONDANSETRON HCL 4 MG PO TABS: 4.0000 mg | ORAL_TABLET | Freq: Four times a day (QID) | ORAL | Status: DC | PRN

## 2020-01-15 MED ORDER — FENTANYL CITRATE (PF) 100 MCG/2ML IJ SOLN
INTRAMUSCULAR | Status: AC | PRN
  Administered 2020-01-15: 25 ug via INTRAVENOUS
  Administered 2020-01-15 (×3): 12.5 ug via INTRAVENOUS

## 2020-01-15 MED ORDER — MORPHINE SULFATE (PF) 2 MG/ML IV SOLN: 2.0000 mg | INTRAVENOUS | Status: DC | PRN

## 2020-01-15 MED ORDER — NALOXONE HCL 0.4 MG/ML IJ SOLN
INTRAMUSCULAR | Status: AC
  Filled 2020-01-15: qty 1

## 2020-01-15 MED ORDER — PREDNISONE 10 MG PO TABS: 10.0000 mg | ORAL_TABLET | Freq: Every day | ORAL | Status: DC

## 2020-01-15 MED ORDER — MIDAZOLAM HCL 2 MG/2ML IJ SOLN
INTRAMUSCULAR | Status: AC | PRN
  Administered 2020-01-15: 0.5 mg via INTRAVENOUS
  Administered 2020-01-15 (×3): .25 mg via INTRAVENOUS

## 2020-01-15 MED ORDER — PREDNISONE 20 MG PO TABS
20.0000 mg | ORAL_TABLET | Freq: Every day | ORAL | Status: AC
  Administered 2020-01-15 – 2020-01-17 (×3): 20 mg via ORAL
  Filled 2020-01-15 (×3): qty 1

## 2020-01-15 MED ORDER — PANTOPRAZOLE SODIUM 40 MG PO TBEC
40.0000 mg | DELAYED_RELEASE_TABLET | Freq: Every day | ORAL | Status: DC
  Administered 2020-01-16 – 2020-01-17 (×2): 40 mg via ORAL
  Filled 2020-01-15 (×2): qty 1

## 2020-01-15 MED ORDER — ZOLPIDEM TARTRATE 5 MG PO TABS: 5.0000 mg | ORAL_TABLET | Freq: Every evening | ORAL | Status: DC | PRN

## 2020-01-15 MED ORDER — BISACODYL 10 MG RE SUPP: 10.0000 mg | RECTAL | Status: DC | PRN

## 2020-01-15 MED ORDER — ONDANSETRON HCL 4 MG/2ML IJ SOLN: 4.0000 mg | Freq: Four times a day (QID) | INTRAMUSCULAR | Status: DC | PRN

## 2020-01-15 MED ORDER — LEVOTHYROXINE SODIUM 100 MCG PO TABS
300.0000 ug | ORAL_TABLET | Freq: Every day | ORAL | Status: DC
  Administered 2020-01-16 – 2020-01-17 (×2): 300 ug via ORAL
  Filled 2020-01-15 (×2): qty 3

## 2020-01-15 MED ORDER — SODIUM CHLORIDE 0.9 % IV SOLN
10.0000 mL/h | Freq: Once | INTRAVENOUS | Status: AC
  Administered 2020-01-15: 10 mL/h via INTRAVENOUS

## 2020-01-15 MED ORDER — FLUMAZENIL 0.5 MG/5ML IV SOLN
INTRAVENOUS | Status: AC
  Filled 2020-01-15: qty 5

## 2020-01-15 MED ORDER — PREDNISONE 20 MG PO TABS: 10.0000 mg | ORAL_TABLET | ORAL | Status: DC

## 2020-01-15 MED ORDER — SODIUM CHLORIDE 0.9 % IV SOLN: INTRAVENOUS | Status: DC

## 2020-01-15 MED ORDER — POLYETHYLENE GLYCOL 3350 17 G PO PACK: 17.0000 g | PACK | Freq: Every day | ORAL | Status: DC | PRN

## 2020-01-15 MED ORDER — LACTATED RINGERS IV SOLN: INTRAVENOUS | Status: DC

## 2020-01-15 MED ORDER — ACETAMINOPHEN 650 MG RE SUPP: 650.0000 mg | Freq: Four times a day (QID) | RECTAL | Status: DC | PRN

## 2020-01-15 MED ORDER — ACETAMINOPHEN 325 MG PO TABS: 650.0000 mg | ORAL_TABLET | Freq: Four times a day (QID) | ORAL | Status: DC | PRN

## 2020-01-15 MED ORDER — HYDROCODONE-ACETAMINOPHEN 5-325 MG PO TABS
1.0000 | ORAL_TABLET | ORAL | Status: DC | PRN
  Administered 2020-01-15: 1 via ORAL
  Filled 2020-01-15: qty 1

## 2020-01-15 MED ORDER — HYDROCORTISONE (PERIANAL) 2.5 % EX CREA
1.0000 "application " | TOPICAL_CREAM | Freq: Two times a day (BID) | CUTANEOUS | Status: DC
  Filled 2020-01-15: qty 28.35

## 2020-01-15 MED ORDER — FENTANYL CITRATE (PF) 100 MCG/2ML IJ SOLN
INTRAMUSCULAR | Status: AC
  Filled 2020-01-15: qty 4

## 2020-01-15 MED ORDER — FOLIC ACID 1 MG PO TABS
1.0000 mg | ORAL_TABLET | Freq: Every day | ORAL | Status: DC
  Administered 2020-01-15 – 2020-01-17 (×3): 1 mg via ORAL
  Filled 2020-01-15 (×3): qty 1

## 2020-01-15 MED ORDER — DOCUSATE SODIUM 100 MG PO CAPS
100.0000 mg | ORAL_CAPSULE | Freq: Two times a day (BID) | ORAL | Status: DC
  Administered 2020-01-15 – 2020-01-17 (×4): 100 mg via ORAL
  Filled 2020-01-15 (×4): qty 1

## 2020-01-15 MED ORDER — AMITRIPTYLINE HCL 25 MG PO TABS
50.0000 mg | ORAL_TABLET | Freq: Every day | ORAL | Status: DC
  Administered 2020-01-15 – 2020-01-16 (×2): 50 mg via ORAL
  Filled 2020-01-15 (×2): qty 2

## 2020-01-15 MED ORDER — MIDAZOLAM HCL 2 MG/2ML IJ SOLN
INTRAMUSCULAR | Status: AC
  Filled 2020-01-15: qty 4

## 2020-01-15 NOTE — Progress Notes (Signed)
Report given to ED Charge. Room assignment for 13 in the ER.
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
## 2020-01-15 NOTE — Discharge Instructions (Signed)
Bone Marrow Aspiration and Bone Marrow Biopsy, Adult, Care After This sheet gives you information about how to care for yourself after your procedure. Your health care provider may also give you more specific instructions. If you have problems or questions, contact your health care provider. What can I expect after the procedure? After the procedure, it is common to have:  Mild pain and tenderness.  Swelling.  Bruising. Follow these instructions at home: Puncture site care   Follow instructions from your health care provider about how to take care of the puncture site. Make sure you: ? Wash your hands with soap and water before and after you change your bandage (dressing). If soap and water are not available, use hand sanitizer. ? Change your dressing as told by your health care provider.  Check your puncture site every day for signs of infection. Check for: ? More redness, swelling, or pain. ? Fluid or blood. ? Warmth. ? Pus or a bad smell. Activity  Return to your normal activities as told by your health care provider. Ask your health care provider what activities are safe for you.  Do not lift anything that is heavier than 10 lb (4.5 kg), or the limit that you are told, until your health care provider says that it is safe.  Do not drive for 24 hours if you were given a sedative during your procedure. General instructions   Take over-the-counter and prescription medicines only as told by your health care provider.  Do not take baths, swim, or use a hot tub until your health care provider approves. Ask your health care provider if you may take showers. You may only be allowed to take sponge baths.  If directed, put ice on the affected area. To do this: ? Put ice in a plastic bag. ? Place a towel between your skin and the bag. ? Leave the ice on for 20 minutes, 2-3 times a day.  Keep all follow-up visits as told by your health care provider. This is important. Contact a  health care provider if:  Your pain is not controlled with medicine.  You have a fever.  You have more redness, swelling, or pain around the puncture site.  You have fluid or blood coming from the puncture site.  Your puncture site feels warm to the touch.  You have pus or a bad smell coming from the puncture site. Summary  After the procedure, it is common to have mild pain, tenderness, swelling, and bruising.  Follow instructions from your health care provider about how to take care of the puncture site and what activities are safe for you.  Take over-the-counter and prescription medicines only as told by your health care provider.  Contact a health care provider if you have any signs of infection, such as fluid or blood coming from the puncture site. This information is not intended to replace advice given to you by your health care provider. Make sure you discuss any questions you have with your health care provider. Document Revised: 02/20/2019 Document Reviewed: 02/20/2019 Elsevier Patient Education  2020 Elsevier Inc. Moderate Conscious Sedation, Adult, Care After These instructions provide you with information about caring for yourself after your procedure. Your health care provider may also give you more specific instructions. Your treatment has been planned according to current medical practices, but problems sometimes occur. Call your health care provider if you have any problems or questions after your procedure. What can I expect after the procedure? After your procedure,   it is common:  To feel sleepy for several hours.  To feel clumsy and have poor balance for several hours.  To have poor judgment for several hours.  To vomit if you eat too soon. Follow these instructions at home: For at least 24 hours after the procedure:   Do not: ? Participate in activities where you could fall or become injured. ? Drive. ? Use heavy machinery. ? Drink alcohol. ? Take  sleeping pills or medicines that cause drowsiness. ? Make important decisions or sign legal documents. ? Take care of children on your own.  Rest. Eating and drinking  Follow the diet recommended by your health care provider.  If you vomit: ? Drink water, juice, or soup when you can drink without vomiting. ? Make sure you have little or no nausea before eating solid foods. General instructions  Have a responsible adult stay with you until you are awake and alert.  Take over-the-counter and prescription medicines only as told by your health care provider.  If you smoke, do not smoke without supervision.  Keep all follow-up visits as told by your health care provider. This is important. Contact a health care provider if:  You keep feeling nauseous or you keep vomiting.  You feel light-headed.  You develop a rash.  You have a fever. Get help right away if:  You have trouble breathing. This information is not intended to replace advice given to you by your health care provider. Make sure you discuss any questions you have with your health care provider. Document Revised: 09/16/2017 Document Reviewed: 01/24/2016 Elsevier Patient Education  2020 Elsevier Inc.  

## 2020-01-15 NOTE — Progress Notes (Signed)
Updated facility, [facility] [place], about patient being taken to ER after procedure. Also, updated that patient would probably be admitted.    Second IV placed and type and screen drawn.

## 2020-01-15 NOTE — Procedures (Signed)
Interventional Radiology Procedure Note  Procedure: CT guided bone marrow aspiration and biopsy  Complications: None  EBL: < 10 mL  Findings: Aspirate and core biopsy performed of bone marrow in right iliac bone.  Plan: Bedrest supine x 1 hrs  Twilla Khouri T. Marnita Poirier, M.D Pager:  [phone]   

## 2020-01-15 NOTE — ED Triage Notes (Signed)
Per short stay nurse patient has autoimmune anemia. HGB fluctuates. Patient was present today for bone marrow, pre workup showed HGB at 5.9. patient oriented to self only. Walks with assistance but very weak. Type & screen completed.

## 2020-01-15 NOTE — Sedation Documentation (Signed)
Pre-procedure medications given
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
## 2020-01-15 NOTE — Progress Notes (Signed)
CRITICAL VALUE ALERT  Critical Value:  Hemoglobin 5.9  Date & Time Notied:  01/15/2020 at 1100  Provider Notified: Rowe Robert, PA   Orders Received/Actions taken: Presently calling Dr. Lorenso Courier. Plans to proceed with Bone Marrow then send patient to the ER.

## 2020-01-16 ENCOUNTER — Encounter: Payer: Self-pay | Admitting: Physician Assistant

## 2020-01-16 DIAGNOSIS — D61818 Other pancytopenia: Secondary | ICD-10-CM | POA: Diagnosis present

## 2020-01-16 DIAGNOSIS — F329 Major depressive disorder, single episode, unspecified: Secondary | ICD-10-CM | POA: Diagnosis not present

## 2020-01-16 DIAGNOSIS — T82538A Leakage of other cardiac and vascular devices and implants, initial encounter: Secondary | ICD-10-CM | POA: Diagnosis present

## 2020-01-16 DIAGNOSIS — Z91040 Latex allergy status: Secondary | ICD-10-CM | POA: Diagnosis not present

## 2020-01-16 DIAGNOSIS — M2550 Pain in unspecified joint: Secondary | ICD-10-CM | POA: Diagnosis not present

## 2020-01-16 DIAGNOSIS — Z7982 Long term (current) use of aspirin: Secondary | ICD-10-CM | POA: Diagnosis not present

## 2020-01-16 DIAGNOSIS — D649 Anemia, unspecified: Secondary | ICD-10-CM

## 2020-01-16 DIAGNOSIS — D5911 Warm autoimmune hemolytic anemia: Secondary | ICD-10-CM | POA: Diagnosis not present

## 2020-01-16 DIAGNOSIS — Z809 Family history of malignant neoplasm, unspecified: Secondary | ICD-10-CM | POA: Diagnosis not present

## 2020-01-16 DIAGNOSIS — Z8249 Family history of ischemic heart disease and other diseases of the circulatory system: Secondary | ICD-10-CM | POA: Diagnosis not present

## 2020-01-16 DIAGNOSIS — Z85828 Personal history of other malignant neoplasm of skin: Secondary | ICD-10-CM | POA: Diagnosis not present

## 2020-01-16 DIAGNOSIS — Z885 Allergy status to narcotic agent status: Secondary | ICD-10-CM | POA: Diagnosis not present

## 2020-01-16 DIAGNOSIS — I10 Essential (primary) hypertension: Secondary | ICD-10-CM | POA: Diagnosis present

## 2020-01-16 DIAGNOSIS — Z880 Allergy status to penicillin: Secondary | ICD-10-CM | POA: Diagnosis not present

## 2020-01-16 DIAGNOSIS — I714 Abdominal aortic aneurysm, without rupture: Secondary | ICD-10-CM | POA: Diagnosis not present

## 2020-01-16 DIAGNOSIS — E039 Hypothyroidism, unspecified: Secondary | ICD-10-CM | POA: Diagnosis present

## 2020-01-16 DIAGNOSIS — Z882 Allergy status to sulfonamides status: Secondary | ICD-10-CM | POA: Diagnosis not present

## 2020-01-16 DIAGNOSIS — Z87891 Personal history of nicotine dependence: Secondary | ICD-10-CM | POA: Diagnosis not present

## 2020-01-16 DIAGNOSIS — F0391 Unspecified dementia with behavioral disturbance: Secondary | ICD-10-CM | POA: Diagnosis present

## 2020-01-16 DIAGNOSIS — K219 Gastro-esophageal reflux disease without esophagitis: Secondary | ICD-10-CM | POA: Diagnosis not present

## 2020-01-16 DIAGNOSIS — Y828 Other medical devices associated with adverse incidents: Secondary | ICD-10-CM | POA: Diagnosis present

## 2020-01-16 DIAGNOSIS — Z79890 Hormone replacement therapy (postmenopausal): Secondary | ICD-10-CM | POA: Diagnosis not present

## 2020-01-16 DIAGNOSIS — Z791 Long term (current) use of non-steroidal anti-inflammatories (NSAID): Secondary | ICD-10-CM | POA: Diagnosis not present

## 2020-01-16 DIAGNOSIS — Z981 Arthrodesis status: Secondary | ICD-10-CM | POA: Diagnosis not present

## 2020-01-16 DIAGNOSIS — Z95828 Presence of other vascular implants and grafts: Secondary | ICD-10-CM | POA: Diagnosis not present

## 2020-01-16 DIAGNOSIS — N179 Acute kidney failure, unspecified: Secondary | ICD-10-CM | POA: Diagnosis present

## 2020-01-16 DIAGNOSIS — Z66 Do not resuscitate: Secondary | ICD-10-CM | POA: Diagnosis present

## 2020-01-16 DIAGNOSIS — D589 Hereditary hemolytic anemia, unspecified: Secondary | ICD-10-CM | POA: Diagnosis present

## 2020-01-16 DIAGNOSIS — R531 Weakness: Secondary | ICD-10-CM | POA: Diagnosis not present

## 2020-01-16 DIAGNOSIS — Z20822 Contact with and (suspected) exposure to covid-19: Secondary | ICD-10-CM | POA: Diagnosis present

## 2020-01-16 DIAGNOSIS — Z7401 Bed confinement status: Secondary | ICD-10-CM | POA: Diagnosis not present

## 2020-01-16 DIAGNOSIS — E785 Hyperlipidemia, unspecified: Secondary | ICD-10-CM | POA: Diagnosis present

## 2020-01-16 DIAGNOSIS — M069 Rheumatoid arthritis, unspecified: Secondary | ICD-10-CM | POA: Diagnosis present

## 2020-01-16 LAB — CBC
HCT: 22.6 % — ABNORMAL LOW (ref 36.0–46.0)
Hemoglobin: 7.4 g/dL — ABNORMAL LOW (ref 12.0–15.0)
MCH: 32 pg (ref 26.0–34.0)
MCHC: 32.7 g/dL (ref 30.0–36.0)
MCV: 97.8 fL (ref 80.0–100.0)
Platelets: 119 10*3/uL — ABNORMAL LOW (ref 150–400)
RBC: 2.31 MIL/uL — ABNORMAL LOW (ref 3.87–5.11)
RDW: 17 % — ABNORMAL HIGH (ref 11.5–15.5)
WBC: 2.6 10*3/uL — ABNORMAL LOW (ref 4.0–10.5)
nRBC: 0 % (ref 0.0–0.2)

## 2020-01-16 LAB — BASIC METABOLIC PANEL
Anion gap: 7 (ref 5–15)
BUN: 44 mg/dL — ABNORMAL HIGH (ref 8–23)
CO2: 24 mmol/L (ref 22–32)
Calcium: 8.3 mg/dL — ABNORMAL LOW (ref 8.9–10.3)
Chloride: 103 mmol/L (ref 98–111)
Creatinine, Ser: 1.72 mg/dL — ABNORMAL HIGH (ref 0.44–1.00)
GFR calc Af Amer: 32 mL/min — ABNORMAL LOW (ref >60)
GFR calc non Af Amer: 28 mL/min — ABNORMAL LOW (ref >60)
Glucose, Bld: 89 mg/dL (ref 70–99)
Potassium: 4.8 mmol/L (ref 3.5–5.1)
Sodium: 134 mmol/L — ABNORMAL LOW (ref 135–145)

## 2020-01-16 LAB — PREPARE RBC (CROSSMATCH)

## 2020-01-16 LAB — HEMOGLOBIN AND HEMATOCRIT, BLOOD
HCT: 25.5 % — ABNORMAL LOW (ref 36.0–46.0)
Hemoglobin: 8.3 g/dL — ABNORMAL LOW (ref 12.0–15.0)

## 2020-01-16 MED ORDER — MORPHINE SULFATE (PF) 2 MG/ML IV SOLN: 2.0000 mg | INTRAVENOUS | Status: DC | PRN

## 2020-01-16 MED ORDER — SODIUM CHLORIDE 0.9% IV SOLUTION: Freq: Once | INTRAVENOUS | Status: AC

## 2020-01-16 MED ORDER — HALOPERIDOL LACTATE 5 MG/ML IJ SOLN
1.0000 mg | Freq: Four times a day (QID) | INTRAMUSCULAR | Status: DC | PRN
  Administered 2020-01-16: 1 mg via INTRAVENOUS
  Filled 2020-01-16: qty 1

## 2020-01-16 NOTE — TOC Transition Note (Signed)
Transition of Care [facility]) - CM/SW Discharge Note   Patient Details  Name: Adonia Manger MRN: [id number] Date of Birth: 06-10-1941  [facility]) CM/SW Contact:  Trish Mage, LCSW Phone Number: 01/16/2020, 9:27 AM   Clinical Narrative:  Patient [facility] here for anemia, returning to [facility] today.  PTAR arranged.  Nursing, please call report to [phone], room 600B. TOC sign off.     Final next level of care: Skilled Nursing Facility Barriers to Discharge: No Barriers Identified   Patient Goals and CMS Choice        Discharge Placement                       Discharge Plan and Services                                     Social Determinants of Health (SDOH) Interventions     Readmission Risk Interventions No flowsheet data found.

## 2020-01-16 NOTE — Progress Notes (Signed)
PROGRESS NOTE    Joanne Whitehead  ION:[id number] DOB: 1941/07/07 DOA: 01/15/2020 PCP: Lonie Peak, PA-C   Brief Narrative:79 y.o. female with medical history significant of hypothyroidism; RA; HTN; HLD; AAA sp repair; mild dementia; and autoimmune hemolytic anemia with severe pancytopenia presenting for bone marrow biopsy via IR due to concern for splenic marginal zone lymphoma vs. Waldenstrom's macroglobulinemia.  Bone marrow biopsy was performed but patient had Hgb of 5.9 and so was sent to the ER for further evaluation and management.  She went to [facility] and was anemic so sent to [facility] with blood transfusions.  She was sent to rehab at [facility].  She came in tomorrow for bone marrow biopsy and was found to be anemic.  She has been ok.  She is very tired.  She is not having active bleeding.  She was last admitted from 3/4-15 for warm autoimmune hemolytic anemia with hypovolemic shock secondary to anemia.  She was started on a prednisone taper and discharged to SNF.  She did have concern for type 2 endoleak of her AAA repair and is scheduled to see vascular surgery as an outpatient on 3/31.  She was seen in f/u at the [facility] on 3/18 and was scheduled for bone marrow biopsy with significant lab tests also ordered.  She was recommended to continue her prednisone taper and folic acid.  ED Course:  Needs transfusion.  She has had extensive evaluation, last saw onc on 3/18.  Her next plan is for evaluation for underlying cancer, had bone marrow biopsy today.  IR did labs and Hgb is 5.5.  Dr. Leonides Schanz will see tomorrow.  She reports that she is only willing to stay for 1 night.  Review of Systems: Unable to perform due to patient's severe hearing loss as well as her clear unhappiness with being in the hospital  Assessment & Plan:   Principal Problem:   Symptomatic anemia Active Problems:   Benign essential hypertension   Hyperlipidemia   Hypothyroidism   Rheumatoid arthritis  (HCC)   AAA (abdominal aortic aneurysm) (HCC)   AKI (acute kidney injury) (HCC)   Hemolytic anemia (HCC)   Dementia with behavioral disturbance (HCC)   #1 -Symptomatic anemia/hemolytic anemia-patient had bone marrow biopsy yesterday for further evaluation of her anemia.  Patient received 1 unit of blood transfusion today.  Hemoglobin on admission was 5.9 Hemoglobin after transfusion with 1 unit is 7.4 Second unit of blood transfusion ordered. Dr. Leonides Schanz to see her today. Continue prednisone taper and folate.  #2 AKI improved with hydration creatinine today 1.72 down from 2.15 Continue to hold HCTZ Mobic and Cozaar.  #3 history of essential hypertension on Norvasc HCTZ and ARB prior to admission.  Blood pressure 123/67.  Continue to hold antihypertensives restart as needed.  #4 history of dementia and hard of hearing  #5 history of rheumatoid arthritis on methotrexate and Arava appears to be on hold per [facility].    #6 hyperlipidemia not on any medications  #7 hypothyroidism continue Synthroid.  #8 AAA she is supposed to have an appointment with vascular today which will need to be rescheduled.  Estimated body mass index is 18.14 kg/m as calculated from the following:   Height as of this encounter: 5\' 1"  (1.549 m).   Weight as of this encounter: 43.5 kg.  DVT prophylaxis: SCD Code Status: DO NOT RESUSCITATE  family Communication dw son kevin Disposition Plan: She came from [facility] rehab [facility] She will be going back to  the SNF [facility] to discharge ongoing blood transfusion anemia and need oncology clearance prior to discharge  Consultants:   Oncology  Procedures: None Antimicrobials: None  Subjective: She is resting in bed She reported she got her first unit of blood that is all she would talk to me  Objective: Vitals:   01/16/20 0555 01/16/20 1233 01/16/20 1300 01/16/20 1309  BP: (!) 109/57 117/64 123/67 123/67  Pulse: 70 70 66 66  Resp: 20 20 20 20   Temp: (!)  97.4 F (36.3 C) 97.7 F (36.5 C) (!) 97.1 F (36.2 C) (!) 97.1 F (36.2 C)  TempSrc: Oral Oral Oral Oral  SpO2: 100% 93% 97% 97%  Weight:      Height:        Intake/Output Summary (Last 24 hours) at 01/16/2020 1357 Last data filed at 01/16/2020 0400 Gross per 24 hour  Intake 731.72 ml  Output --  Net 731.72 ml   Filed Weights   01/15/20 1238  Weight: 43.5 kg    Examination:  General exam: Appears calm and comfortable  Respiratory system: Clear to auscultation. Respiratory effort normal. Cardiovascular system: S1 & S2 heard, RRR. No JVD, murmurs, rubs, gallops or clicks. No pedal edema. Gastrointestinal system: Abdomen is nondistended, soft and nontender. No organomegaly or masses felt. Normal bowel sounds heard. Central nervous system: Alert and oriented. No focal neurological deficits. Extremities: Symmetric 5 x 5 power. Skin: No rashes, lesions or ulcers Psychiatry:unable to assess  Data Reviewed: I have personally reviewed following labs and imaging studies  CBC: Recent Labs  Lab 01/15/20 1000 01/16/20 0837  WBC 3.3* 2.6*  NEUTROABS 2.4  --   HGB 5.9* 7.4*  HCT 18.8* 22.6*  MCV 104.4* 97.8  PLT 136* 119*   Basic Metabolic Panel: Recent Labs  Lab 01/15/20 1348 01/16/20 0837  NA 135 134*  K 4.7 4.8  CL 102 103  CO2 26 24  GLUCOSE 95 89  BUN 53* 44*  CREATININE 2.15* 1.72*  CALCIUM 8.2* 8.3*   GFR: Estimated Creatinine Clearance: 18.5 mL/min (A) (by C-G formula based on SCr of 1.72 mg/dL (H)). Liver Function Tests: No results for input(s): AST, ALT, ALKPHOS, BILITOT, PROT, ALBUMIN in the last 168 hours. No results for input(s): LIPASE, AMYLASE in the last 168 hours. No results for input(s): AMMONIA in the last 168 hours. Coagulation Profile: Recent Labs  Lab 01/15/20 1000  INR 1.0   Cardiac Enzymes: No results for input(s): CKTOTAL, CKMB, CKMBINDEX, TROPONINI in the last 168 hours. BNP (last 3 results) No results for input(s): PROBNP in the  last 8760 hours. HbA1C: No results for input(s): HGBA1C in the last 72 hours. CBG: No results for input(s): GLUCAP in the last 168 hours. Lipid Profile: No results for input(s): CHOL, HDL, LDLCALC, TRIG, CHOLHDL, LDLDIRECT in the last 72 hours. Thyroid Function Tests: Recent Labs    01/15/20 1654  TSH 8.822*   Anemia Panel: No results for input(s): VITAMINB12, FOLATE, FERRITIN, TIBC, IRON, RETICCTPCT in the last 72 hours. Sepsis Labs: No results for input(s): PROCALCITON, LATICACIDVEN in the last 168 hours.  Recent Results (from the past 240 hour(s))  SARS CORONAVIRUS 2 (TAT 6-24 HRS) Nasopharyngeal Nasopharyngeal Swab     Status: None   Collection Time: 01/15/20  1:52 PM   Specimen: Nasopharyngeal Swab  Result Value Ref Range Status   SARS Coronavirus 2 NEGATIVE NEGATIVE Final    Comment: (NOTE) SARS-CoV-2 target nucleic acids are NOT DETECTED. The SARS-CoV-2 RNA is generally detectable in upper  and lower respiratory specimens during the acute phase of infection. Negative results do not preclude SARS-CoV-2 infection, do not rule out co-infections with other pathogens, and should not be used as the sole basis for treatment or other patient management decisions. Negative results must be combined with clinical observations, patient history, and epidemiological information. The expected result is Negative. Fact Sheet for Patients: HairSlick.no Fact Sheet for Healthcare Providers: quierodirigir.com This test is not yet approved or cleared by the [place] FDA and  has been authorized for detection and/or diagnosis of SARS-CoV-2 by FDA under an Emergency Use Authorization (EUA). This EUA will remain  in effect (meaning this test can be used) for the duration of the COVID-19 declaration under Section 56 4(b)(1) of the Act, 21 U.S.C. section 360bbb-3(b)(1), unless the authorization is terminated or revoked  sooner. Performed at [facility], [address], [place], [place] [address]          Radiology Studies: CT BIOPSY  Result Date: Jan 21, 2020 CLINICAL DATA:  Pancytopenia, hemolytic anemia and splenomegaly. Need for bone marrow biopsy. EXAM: CT GUIDED BONE MARROW ASPIRATION AND BIOPSY ANESTHESIA/SEDATION: Versed 1.25 mg IV, Fentanyl 62.5 mcg IV Total Moderate Sedation Time:   13 minutes. The patient's level of consciousness and physiologic status were continuously monitored during the procedure by Radiology nursing. PROCEDURE: The procedure risks, benefits, and alternatives were explained to the patient's son. Questions regarding the procedure were encouraged and answered. The patient's son understands and consents to the procedure. A time out was performed prior to initiating the procedure. The right gluteal region was prepped with chlorhexidine. Sterile gown and sterile gloves were used for the procedure. Local anesthesia was provided with 1% Lidocaine. Under CT guidance, an 11 gauge On Control bone cutting needle was advanced from a posterior approach into the right iliac bone. Needle positioning was confirmed with CT. Initial non heparinized and heparinized aspirate samples were obtained of bone marrow. Core biopsy was performed via the On Control drill needle. COMPLICATIONS: None FINDINGS: Inspection of initial aspirate did reveal visible particles. Intact core biopsy sample was obtained. IMPRESSION: CT guided bone marrow biopsy of right posterior iliac bone with both aspirate and core samples obtained. Electronically Signed   By: Irish Lack M.D.   On: January 21, 2020 16:18   CT BONE MARROW BIOPSY & ASPIRATION  Result Date: 01/21/20 CLINICAL DATA:  Pancytopenia, hemolytic anemia and splenomegaly. Need for bone marrow biopsy. EXAM: CT GUIDED BONE MARROW ASPIRATION AND BIOPSY ANESTHESIA/SEDATION: Versed 1.25 mg IV, Fentanyl 62.5 mcg IV Total Moderate Sedation Time:   13 minutes. The  patient's level of consciousness and physiologic status were continuously monitored during the procedure by Radiology nursing. PROCEDURE: The procedure risks, benefits, and alternatives were explained to the patient's son. Questions regarding the procedure were encouraged and answered. The patient's son understands and consents to the procedure. A time out was performed prior to initiating the procedure. The right gluteal region was prepped with chlorhexidine. Sterile gown and sterile gloves were used for the procedure. Local anesthesia was provided with 1% Lidocaine. Under CT guidance, an 11 gauge On Control bone cutting needle was advanced from a posterior approach into the right iliac bone. Needle positioning was confirmed with CT. Initial non heparinized and heparinized aspirate samples were obtained of bone marrow. Core biopsy was performed via the On Control drill needle. COMPLICATIONS: None FINDINGS: Inspection of initial aspirate did reveal visible particles. Intact core biopsy sample was obtained. IMPRESSION: CT guided bone marrow biopsy of right posterior  iliac bone with both aspirate and core samples obtained. Electronically Signed   By: Irish Lack M.D.   On: 01/15/2020 16:18        Scheduled Meds:  amitriptyline  50 mg Oral QHS   docusate sodium  100 mg Oral BID   folic acid  1 mg Oral Daily   hydrocortisone  1 application Rectal BID   levothyroxine  300 mcg Oral QAC breakfast   pantoprazole  40 mg Oral Daily   predniSONE  20 mg Oral Q breakfast   Followed by   Melene Muller ON 01/18/2020] predniSONE  10 mg Oral Q breakfast   Continuous Infusions:  lactated ringers 75 mL/hr at 01/15/20 1749     LOS: 0 days     Alwyn Ren, MD 01/16/2020, 1:57 PM

## 2020-01-16 NOTE — Progress Notes (Signed)
Patient's 2 unit blood transfusion complete. Lab scheduled CBC labs for 7am. No reaction, patient tolerated well. Blood warmer used. On call notified hypotension, no new orders. On call stated ok to transfuse, no pre-meds ordered. Will continue to monitor.

## 2020-01-16 NOTE — Progress Notes (Signed)
Spoke w/ Dr. Rodena Piety she informed me that patient will not be discharging. I informed her that patient is wanting to take tele off. Dr. Rodena Piety stated to discontinue tele and patient should discharge tomorrow (Thursday 01/17/20) back to SNF.

## 2020-01-17 DIAGNOSIS — D589 Hereditary hemolytic anemia, unspecified: Secondary | ICD-10-CM | POA: Diagnosis not present

## 2020-01-17 DIAGNOSIS — F0391 Unspecified dementia with behavioral disturbance: Secondary | ICD-10-CM | POA: Diagnosis not present

## 2020-01-17 DIAGNOSIS — T82538A Leakage of other cardiac and vascular devices and implants, initial encounter: Secondary | ICD-10-CM | POA: Diagnosis not present

## 2020-01-17 DIAGNOSIS — Z20822 Contact with and (suspected) exposure to covid-19: Secondary | ICD-10-CM | POA: Diagnosis not present

## 2020-01-17 DIAGNOSIS — N179 Acute kidney failure, unspecified: Secondary | ICD-10-CM | POA: Diagnosis not present

## 2020-01-17 DIAGNOSIS — D61818 Other pancytopenia: Secondary | ICD-10-CM | POA: Diagnosis not present

## 2020-01-17 LAB — BPAM RBC
Blood Product Expiration Date: 202104292359
Blood Product Expiration Date: 202104292359
Blood Product Expiration Date: 202104292359
ISSUE DATE / TIME: 202103302259
ISSUE DATE / TIME: 202103310209
ISSUE DATE / TIME: 202103311241
Unit Type and Rh: 5100
Unit Type and Rh: 5100
Unit Type and Rh: 5100

## 2020-01-17 LAB — BASIC METABOLIC PANEL
Anion gap: 5 (ref 5–15)
BUN: 38 mg/dL — ABNORMAL HIGH (ref 8–23)
CO2: 23 mmol/L (ref 22–32)
Calcium: 8.1 mg/dL — ABNORMAL LOW (ref 8.9–10.3)
Chloride: 106 mmol/L (ref 98–111)
Creatinine, Ser: 1.36 mg/dL — ABNORMAL HIGH (ref 0.44–1.00)
GFR calc Af Amer: 43 mL/min — ABNORMAL LOW (ref >60)
GFR calc non Af Amer: 37 mL/min — ABNORMAL LOW (ref >60)
Glucose, Bld: 73 mg/dL (ref 70–99)
Potassium: 4.4 mmol/L (ref 3.5–5.1)
Sodium: 134 mmol/L — ABNORMAL LOW (ref 135–145)

## 2020-01-17 LAB — TYPE AND SCREEN
ABO/RH(D): O POS
Antibody Screen: POSITIVE
DAT, IgG: POSITIVE
Donor AG Type: NEGATIVE
Donor AG Type: NEGATIVE
Donor AG Type: NEGATIVE
Unit division: 0
Unit division: 0
Unit division: 0

## 2020-01-17 LAB — SURGICAL PATHOLOGY

## 2020-01-17 LAB — CBC
HCT: 26.8 % — ABNORMAL LOW (ref 36.0–46.0)
Hemoglobin: 8.7 g/dL — ABNORMAL LOW (ref 12.0–15.0)
MCH: 32.1 pg (ref 26.0–34.0)
MCHC: 32.5 g/dL (ref 30.0–36.0)
MCV: 98.9 fL (ref 80.0–100.0)
Platelets: 119 10*3/uL — ABNORMAL LOW (ref 150–400)
RBC: 2.71 MIL/uL — ABNORMAL LOW (ref 3.87–5.11)
RDW: 18.3 % — ABNORMAL HIGH (ref 11.5–15.5)
WBC: 2.6 10*3/uL — ABNORMAL LOW (ref 4.0–10.5)
nRBC: 0 % (ref 0.0–0.2)

## 2020-01-17 MED ORDER — PREDNISONE 20 MG PO TABS: 40.0000 mg | ORAL_TABLET | Freq: Every day | ORAL | 1 refills | Status: AC

## 2020-01-17 NOTE — Progress Notes (Signed)
Brief Inpatient Progress Note  Joanne Whitehead is a 79 year old female with medical history significant for warm autoimmune hemolytic anemia potentially secondary to a hematological malignancy such as marginal splenic zone lymphoma versus Walden Strom's macroglobulinemia.  She was last seen in our clinic on 01/03/2020 at which time we spoke with the patient and her son and we recommended a bone marrow biopsy moving forward in order to confirm the diagnosis.  The patient presented on 01/15/2020 for the planned procedure and was found to have a hemoglobin of 5.9.  Additionally he was found to have a white blood cell count of 3.3, MCV of 104.4, and a platelet count of 136.  Due to concern for this worsening anemia the patient was admitted to the general medicine service for further evaluation and management.  On exam today the patient continues to be difficult to extract information from.  She has a generally disagreeable disposition and cognitive decline and therefore is not able to provide direct answers or concrete information on discussion.  At this time we plan to await the results of the bone marrow biopsy to discuss treatment and plans moving forward.  Unfortunately the patient is becoming progressively more pancytopenic with a decrease in the white blood cell count, the hemoglobin, as well as the platelets.  Recommendations: --transfuse to Hgb >8.0 --await results of Bmbx. F/u clinic visit to be scheduled once the results are available --recommend CBC be drawn at her facility Monday or Tuesday of next week to assure Hgb is stable >8.0. If <8.0 please contact the [facility] at [phone] --please discharge patient on 40 mg prednisone PO daily in the interim.  --can be d/c back to facility once cleared by primary team.  Please do not hesitate to reach out to our service for any questions or concerns regarding the care of this patient.   Ledell Peoples, MD Department of Hematology/Oncology [place] [facility] Phone: [phone] Pager: [phone] Email: [email or web address]

## 2020-01-17 NOTE — Discharge Summary (Signed)
Physician Discharge Summary  Joanne Whitehead NWG:[id number] DOB: 1940-12-31 DOA: 01/15/2020  PCP: Lonie Peak, PA-C  Admit date: 01/15/2020 Discharge date: 01/17/2020  Admitted From: [facility] Disposition: [facility]  Recommendations for Outpatient Follow-up:  1. Follow up with PCP and hematologist Dr. Leonides Schanz in 1-2 weeks 2. Please obtain BMP/CBC on 01/21/2020 if hemoglobin is less than 8 please contact [phone] cancer center for further treatment  3. Please follow up on the following pending results: Bone marrow biopsy  Home Health: None Equipment/Devices: None  Discharge Condition: Stable and improved CODE STATUS: DNR Diet recommendation cardiac Brief/Interim Summary:79 y.o.femalewith medical history significant ofhypothyroidism; RA; HTN; HLD; AAA sp repair;mild dementia;and autoimmune hemolytic anemia with severe pancytopenia presenting for bone marrow biopsy via IR due to concern for splenic marginal zone lymphoma vs. Waldenstrom's macroglobulinemia. Bone marrow biopsy was performed but patient had Hgb of 5.9 and so was sent to the ER for further evaluation and management. She went to [facility] and was anemic so sent to [facility] with blood transfusions. She was sent to rehab at [facility]. She came in tomorrow for bone marrow biopsy and was found to be anemic. She has been ok. She is very tired. She is not having active bleeding.  She was last admitted from 3/4-15 for warm autoimmune hemolytic anemia with hypovolemic shock secondary to anemia. She was started on a prednisone taper and discharged to SNF.She did have concern for type 2 endoleak of her AAA repair and is scheduled to see vascular surgery as an outpatient on 3/31.  She was seen in f/u at the [facility] on 3/18 and was scheduled for bone marrow biopsy with significant lab tests also ordered. She was recommended to continue her prednisone taper and folic acid.  ED Course:Needs  transfusion. She has had extensive evaluation, last saw onc on 3/18. Her next plan is for evaluation for underlying cancer, had bone marrow biopsy today. IR did labs and Hgb is 5.5. Dr. Leonides Schanz will see tomorrow. She reports that she is only willing to stay for 1 night.  Review of Systems:Unable to perform due to patient's severe hearing loss as well as her clear unhappiness with being in the hospital   Discharge Diagnoses:  Principal Problem:   Symptomatic anemia Active Problems:   Benign essential hypertension   Hyperlipidemia   Hypothyroidism   Rheumatoid arthritis (HCC)   AAA (abdominal aortic aneurysm) (HCC)   AKI (acute kidney injury) (HCC)   Hemolytic anemia (HCC)   Dementia with behavioral disturbance (HCC)   Anemia  #1 -Symptomatic anemia/hemolytic anemia-patient had bone marrow biopsy 3/30 for further evaluation of her anemia.  Patient received 3 unit of blood transfusion during the hospital stay. Hemoglobin on admission was 5.9 and hemoglobin on discharge is 8.7. Please keep the patient on prednisone 40 mg daily till seen by hematologist as on follow-up  #2 AKI improved with hydration creatinine today 1.36 down  1.72 down from 2.15 Continue to hold HCTZ and Cozaar.  #3 history of essential hypertension on Norvasc HCTZ and ARB prior to admission.  Blood pressure 123/67.  Continue to hold antihypertensives restart as needed.  #4 history of dementia and hard of hearing  #5 history of rheumatoid arthritis on methotrexate and Arava appears to be on hold per [facility].    #6 hyperlipidemia not on any medications  #7 hypothyroidism continue Synthroid.  #8 AAA she is supposed to have an appointment with vascular 3/31 which will need to be rescheduled.  Estimated body mass index is 18.14 kg/m as calculated from the following:   Height as of this encounter: 5\' 1"  (1.549 m).   Weight as of this encounter: 43.5 kg.  Discharge Instructions  Discharge Instructions     Call MD for:  difficulty breathing, headache or visual disturbances   Complete by: As directed    Call MD for:  redness, tenderness, or signs of infection (pain, swelling, redness, odor or green/yellow discharge around incision site)   Complete by: As directed    Diet - low sodium heart healthy   Complete by: As directed    Increase activity slowly   Complete by: As directed      Allergies as of 01/17/2020      Reactions   Penicillins Itching, Other (See Comments)   PATIENT HAS HAD A PCN REACTION WITH IMMEDIATE RASH, FACIAL/TONGUE/THROAT SWELLING, SOB, OR LIGHTHEADEDNESS WITH HYPOTENSION:  #  #  #  YES  #  #  #   HAS PT DEVELOPED SEVERE RASH INVOLVING MUCUS MEMBRANES or SKIN NECROSIS: #  #  #  YES  #  #  #  Has patient had a PCN reaction that required hospitalization: Unknown Has patient had a PCN reaction occurring within the last 10 years: No If all of the above answers are "NO", then may proceed with Cephalosporin use.   Amoxicillin Itching, Rash   Gemfibrozil Nausea And Vomiting   Latex Other (See Comments)   Ear infection   Sulfa Antibiotics Itching, Rash, Other (See Comments)   Upset stomach   Tramadol Itching      Medication List    STOP taking these medications   acetaminophen 500 MG tablet Commonly known as: TYLENOL   amLODipine 5 MG tablet Commonly known as: NORVASC   hydrochlorothiazide 25 MG tablet Commonly known as: HYDRODIURIL   losartan 25 MG tablet Commonly known as: COZAAR     TAKE these medications   amitriptyline 25 MG tablet Commonly known as: ELAVIL Take 50 mg by mouth at bedtime.   bisacodyl 10 MG suppository Commonly known as: DULCOLAX Place 10 mg rectally as needed for moderate constipation.   docusate sodium 100 MG capsule Commonly known as: COLACE Take 100 mg by mouth 2 (two) times daily.   folic acid 1 MG tablet Commonly known as: FOLVITE Take 1 mg by mouth daily.   hydrocortisone 2.5 % rectal cream Commonly known as:  ANUSOL-HC Place 1 application rectally in the morning and at bedtime.   leflunomide 20 MG tablet Commonly known as: ARAVA Take 20 mg by mouth daily.   levothyroxine 200 MCG tablet Commonly known as: SYNTHROID Take 300 mcg by mouth daily before breakfast.   meloxicam 15 MG tablet Commonly known as: MOBIC Take 15 mg by mouth daily as needed for pain.   methotrexate 2.5 MG tablet Take 15 mg by mouth once a week. Caution:Chemotherapy. Protect from light.   omeprazole 40 MG capsule Commonly known as: PRILOSEC Take 40 mg by mouth daily.   polyethylene glycol 17 g packet Commonly known as: MIRALAX / GLYCOLAX Take 17 g by mouth daily as needed for moderate constipation.   predniSONE 20 MG tablet Commonly known as: Deltasone Take 2 tablets (40 mg total) by mouth daily. What changed:   medication strength  how much to take  when to take this  additional instructions      Follow-up Information    Lonie Peak, PA-C Follow up.   Specialty: Physician Assistant Contact information: [address] [place] [address] [phone]        Jaci Standard, MD Follow up.   Specialty: Hematology and Oncology Contact information: [address] [place] [address] [phone]          Allergies  Allergen Reactions  . Penicillins Itching and Other (See Comments)    PATIENT HAS HAD A PCN REACTION WITH IMMEDIATE RASH, FACIAL/TONGUE/THROAT SWELLING, SOB, OR LIGHTHEADEDNESS WITH HYPOTENSION:  #  #  #  YES  #  #  #   HAS PT DEVELOPED SEVERE RASH INVOLVING MUCUS MEMBRANES or SKIN NECROSIS: #  #  #  YES  #  #  #  Has patient had a PCN reaction that required hospitalization: Unknown Has patient had a PCN reaction occurring within the last 10 years: No If all of the above answers are "NO", then may proceed with Cephalosporin use.   Marland Kitchen Amoxicillin Itching and Rash  . Gemfibrozil Nausea And Vomiting  . Latex Other (See Comments)    Ear infection  . Sulfa  Antibiotics Itching, Rash and Other (See Comments)    Upset stomach   . Tramadol Itching    Consultations: Dr. Leonides Schanz hematologist  Procedures/Studies: CT BIOPSY  Result Date: 01-16-2020 CLINICAL DATA:  Pancytopenia, hemolytic anemia and splenomegaly. Need for bone marrow biopsy. EXAM: CT GUIDED BONE MARROW ASPIRATION AND BIOPSY ANESTHESIA/SEDATION: Versed 1.25 mg IV, Fentanyl 62.5 mcg IV Total Moderate Sedation Time:   13 minutes. The patient's level of consciousness and physiologic status were continuously monitored during the procedure by Radiology nursing. PROCEDURE: The procedure risks, benefits, and alternatives were explained to the patient's son. Questions regarding the procedure were encouraged and answered. The patient's son understands and consents to the procedure. A time out was performed prior to initiating the procedure. The right gluteal region was prepped with chlorhexidine. Sterile gown and sterile gloves were used for the procedure. Local anesthesia was provided with 1% Lidocaine. Under CT guidance, an 11 gauge On Control bone cutting needle was advanced from a posterior approach into the right iliac bone. Needle positioning was confirmed with CT. Initial non heparinized and heparinized aspirate samples were obtained of bone marrow. Core biopsy was performed via the On Control drill needle. COMPLICATIONS: None FINDINGS: Inspection of initial aspirate did reveal visible particles. Intact core biopsy sample was obtained. IMPRESSION: CT guided bone marrow biopsy of right posterior iliac bone with both aspirate and core samples obtained. Electronically Signed   By: Irish Lack M.D.   On: 2020/01/16 16:18   CT BONE MARROW BIOPSY & ASPIRATION  Result Date: 2020/01/16 CLINICAL DATA:  Pancytopenia, hemolytic anemia and splenomegaly. Need for bone marrow biopsy. EXAM: CT GUIDED BONE MARROW ASPIRATION AND BIOPSY ANESTHESIA/SEDATION: Versed 1.25 mg IV, Fentanyl 62.5 mcg IV Total Moderate  Sedation Time:   13 minutes. The patient's level of consciousness and physiologic status were continuously monitored during the procedure by Radiology nursing. PROCEDURE: The procedure risks, benefits, and alternatives were explained to the patient's son. Questions regarding the procedure were encouraged and answered. The patient's son understands and consents to the procedure. A time out was performed prior to initiating the procedure. The right gluteal region was prepped with chlorhexidine. Sterile gown and sterile gloves were used for the procedure. Local anesthesia was provided with 1% Lidocaine. Under CT guidance, an 11 gauge On Control bone cutting needle was advanced from a posterior approach into the right iliac bone. Needle positioning was confirmed with CT. Initial non heparinized and heparinized aspirate  samples were obtained of bone marrow. Core biopsy was performed via the On Control drill needle. COMPLICATIONS: None FINDINGS: Inspection of initial aspirate did reveal visible particles. Intact core biopsy sample was obtained. IMPRESSION: CT guided bone marrow biopsy of right posterior iliac bone with both aspirate and core samples obtained. Electronically Signed   By: Irish Lack M.D.   On: 01/15/2020 16:18    (Echo, Carotid, EGD, Colonoscopy, ERCP)    Subjective: Awake calm no new complaints  Discharge Exam: Vitals:   01/16/20 2050 01/17/20 0509  BP: (!) 113/59 114/61  Pulse: 72 71  Resp: 20 16  Temp: (!) 97.5 F (36.4 C) (!) 97.5 F (36.4 C)  SpO2: 98% 97%   Vitals:   01/16/20 1309 01/16/20 1511 01/16/20 2050 01/17/20 0509  BP: 123/67 118/64 (!) 113/59 114/61  Pulse: 66 76 72 71  Resp: 20 (!) 22 20 16   Temp: (!) 97.1 F (36.2 C) 98.7 F (37.1 C) (!) 97.5 F (36.4 C) (!) 97.5 F (36.4 C)  TempSrc: Oral Oral    SpO2: 97% 92% 98% 97%  Weight:      Height:        General: Pt is alert, awake, not in acute distress Cardiovascular: RRR, S1/S2 +, no rubs, no  gallops Respiratory: CTA bilaterally, no wheezing, no rhonchi Abdominal: Soft, NT, ND, bowel sounds + Extremities: no edema, no cyanosis    The results of significant diagnostics from this hospitalization (including imaging, microbiology, ancillary and laboratory) are listed below for reference.     Microbiology: Recent Results (from the past 240 hour(s))  SARS CORONAVIRUS 2 (TAT 6-24 HRS) Nasopharyngeal Nasopharyngeal Swab     Status: None   Collection Time: 01/15/20  1:52 PM   Specimen: Nasopharyngeal Swab  Result Value Ref Range Status   SARS Coronavirus 2 NEGATIVE NEGATIVE Final    Comment: (NOTE) SARS-CoV-2 target nucleic acids are NOT DETECTED. The SARS-CoV-2 RNA is generally detectable in upper and lower respiratory specimens during the acute phase of infection. Negative results do not preclude SARS-CoV-2 infection, do not rule out co-infections with other pathogens, and should not be used as the sole basis for treatment or other patient management decisions. Negative results must be combined with clinical observations, patient history, and epidemiological information. The expected result is Negative. Fact Sheet for Patients: HairSlick.no Fact Sheet for Healthcare Providers: quierodirigir.com This test is not yet approved or cleared by the [place] FDA and  has been authorized for detection and/or diagnosis of SARS-CoV-2 by FDA under an Emergency Use Authorization (EUA). This EUA will remain  in effect (meaning this test can be used) for the duration of the COVID-19 declaration under Section 56 4(b)(1) of the Act, 21 U.S.C. section 360bbb-3(b)(1), unless the authorization is terminated or revoked sooner. Performed at [facility], [address] [place], [place] [address]      Labs: BNP (last 3 results) No results for input(s): BNP in the last 8760 hours. Basic Metabolic Panel: Recent Labs  Lab  01/15/20 1348 01/16/20 0837 01/17/20 0508  NA 135 134* 134*  K 4.7 4.8 4.4  CL 102 103 106  CO2 26 24 23   GLUCOSE 95 89 73  BUN 53* 44* 38*  CREATININE 2.15* 1.72* 1.36*  CALCIUM 8.2* 8.3* 8.1*   Liver Function Tests: No results for input(s): AST, ALT, ALKPHOS, BILITOT, PROT, ALBUMIN in the last 168 hours. No results for input(s): LIPASE, AMYLASE in the last 168 hours. No results for input(s): AMMONIA  in the last 168 hours. CBC: Recent Labs  Lab 01/15/20 1000 01/16/20 0837 01/16/20 2030 01/17/20 0508  WBC 3.3* 2.6*  --  2.6*  NEUTROABS 2.4  --   --   --   HGB 5.9* 7.4* 8.3* 8.7*  HCT 18.8* 22.6* 25.5* 26.8*  MCV 104.4* 97.8  --  98.9  PLT 136* 119*  --  119*   Cardiac Enzymes: No results for input(s): CKTOTAL, CKMB, CKMBINDEX, TROPONINI in the last 168 hours. BNP: Invalid input(s): POCBNP CBG: No results for input(s): GLUCAP in the last 168 hours. D-Dimer No results for input(s): DDIMER in the last 72 hours. Hgb A1c No results for input(s): HGBA1C in the last 72 hours. Lipid Profile No results for input(s): CHOL, HDL, LDLCALC, TRIG, CHOLHDL, LDLDIRECT in the last 72 hours. Thyroid function studies Recent Labs    01/15/20 1654  TSH 8.822*   Anemia work up No results for input(s): VITAMINB12, FOLATE, FERRITIN, TIBC, IRON, RETICCTPCT in the last 72 hours. Urinalysis No results found for: COLORURINE, APPEARANCEUR, LABSPEC, PHURINE, GLUCOSEU, HGBUR, BILIRUBINUR, KETONESUR, PROTEINUR, UROBILINOGEN, NITRITE, LEUKOCYTESUR Sepsis Labs Invalid input(s): PROCALCITONIN,  WBC,  LACTICIDVEN Microbiology Recent Results (from the past 240 hour(s))  SARS CORONAVIRUS 2 (TAT 6-24 HRS) Nasopharyngeal Nasopharyngeal Swab     Status: None   Collection Time: 01/15/20  1:52 PM   Specimen: Nasopharyngeal Swab  Result Value Ref Range Status   SARS Coronavirus 2 NEGATIVE NEGATIVE Final    Comment: (NOTE) SARS-CoV-2 target nucleic acids are NOT DETECTED. The SARS-CoV-2 RNA is  generally detectable in upper and lower respiratory specimens during the acute phase of infection. Negative results do not preclude SARS-CoV-2 infection, do not rule out co-infections with other pathogens, and should not be used as the sole basis for treatment or other patient management decisions. Negative results must be combined with clinical observations, patient history, and epidemiological information. The expected result is Negative. Fact Sheet for Patients: HairSlick.no Fact Sheet for Healthcare Providers: quierodirigir.com This test is not yet approved or cleared by the [place] FDA and  has been authorized for detection and/or diagnosis of SARS-CoV-2 by FDA under an Emergency Use Authorization (EUA). This EUA will remain  in effect (meaning this test can be used) for the duration of the COVID-19 declaration under Section 56 4(b)(1) of the Act, 21 U.S.C. section 360bbb-3(b)(1), unless the authorization is terminated or revoked sooner. Performed at [facility], [address], [place], [place] [address]      Time coordinating discharge: 39 minutes  SIGNED:   Alwyn Ren, MD  [facility] 01/17/2020, 9:44 AM Pager   If 7PM-7AM, please contact night-coverage [email or web address] Password [email or web address]

## 2020-01-22 ENCOUNTER — Encounter (HOSPITAL_COMMUNITY): Payer: Self-pay | Admitting: Hematology and Oncology

## 2020-01-23 DIAGNOSIS — E785 Hyperlipidemia, unspecified: Secondary | ICD-10-CM | POA: Diagnosis not present

## 2020-01-23 DIAGNOSIS — D589 Hereditary hemolytic anemia, unspecified: Secondary | ICD-10-CM | POA: Diagnosis not present

## 2020-01-23 DIAGNOSIS — I10 Essential (primary) hypertension: Secondary | ICD-10-CM | POA: Diagnosis not present

## 2020-01-23 DIAGNOSIS — I714 Abdominal aortic aneurysm, without rupture: Secondary | ICD-10-CM | POA: Diagnosis not present

## 2020-01-29 ENCOUNTER — Other Ambulatory Visit: Payer: Self-pay | Admitting: Hematology and Oncology

## 2020-01-29 DIAGNOSIS — D591 Autoimmune hemolytic anemia, unspecified: Secondary | ICD-10-CM

## 2020-01-30 ENCOUNTER — Inpatient Hospital Stay: Payer: No Typology Code available for payment source | Admitting: Hematology and Oncology

## 2020-01-30 ENCOUNTER — Inpatient Hospital Stay: Payer: No Typology Code available for payment source

## 2020-01-30 ENCOUNTER — Telehealth: Payer: Self-pay | Admitting: Hematology and Oncology

## 2020-01-30 ENCOUNTER — Telehealth: Payer: Self-pay | Admitting: *Deleted

## 2020-01-30 NOTE — Telephone Encounter (Signed)
Pt did not arrive for appt @ 8am. TCT her rehab facility and spoke with CNA on her hall.  Nurse is not available. Pt is still there.  Advised that pt had an appt this morning with Dr. Lorenso Courier. CNA stated that she will have pt's nurse return call. Direct phone # provided.  Dr. Lorenso Courier aware.

## 2020-01-30 NOTE — Telephone Encounter (Signed)
Scheduled appt per 4/14 sch message - pt son and facility aware

## 2020-01-31 ENCOUNTER — Telehealth: Payer: Self-pay | Admitting: *Deleted

## 2020-01-31 NOTE — Telephone Encounter (Signed)
Received call from pt's nurse @ [facility].  She is asking about pt's medications-specifically Methotrexate and Leflunomide. Apparently these are both on hold since her hospital discharge. She is on Prednisone 20mg  BID. The discharge orders were for pt to continue this until her appt with Dr. Lorenso Courier. Pt refused to come to her appt with Dr. Lorenso Courier yesterday.  She is still insisting that she will be going home today.  Ryan, RN states that they will be having a care conference about her discharge today. She is unsure of discharge plan at this time.  Please advise about the medications.

## 2020-02-04 ENCOUNTER — Emergency Department (HOSPITAL_COMMUNITY)
Admission: EM | Admit: 2020-02-04 | Discharge: 2020-02-04 | Disposition: A | Discharge status: Skilled Nursing Facility | Payer: Medicare Other | Attending: Emergency Medicine | Admitting: Emergency Medicine

## 2020-02-04 ENCOUNTER — Encounter (HOSPITAL_COMMUNITY): Payer: Self-pay | Admitting: *Deleted

## 2020-02-04 ENCOUNTER — Other Ambulatory Visit: Payer: Self-pay

## 2020-02-04 DIAGNOSIS — Z79899 Other long term (current) drug therapy: Secondary | ICD-10-CM | POA: Diagnosis not present

## 2020-02-04 DIAGNOSIS — I10 Essential (primary) hypertension: Secondary | ICD-10-CM | POA: Insufficient documentation

## 2020-02-04 DIAGNOSIS — I714 Abdominal aortic aneurysm, without rupture: Secondary | ICD-10-CM | POA: Diagnosis not present

## 2020-02-04 DIAGNOSIS — Z91040 Latex allergy status: Secondary | ICD-10-CM | POA: Diagnosis not present

## 2020-02-04 DIAGNOSIS — Z87891 Personal history of nicotine dependence: Secondary | ICD-10-CM | POA: Diagnosis not present

## 2020-02-04 DIAGNOSIS — F0391 Unspecified dementia with behavioral disturbance: Secondary | ICD-10-CM | POA: Insufficient documentation

## 2020-02-04 DIAGNOSIS — E039 Hypothyroidism, unspecified: Secondary | ICD-10-CM | POA: Diagnosis not present

## 2020-02-04 DIAGNOSIS — D649 Anemia, unspecified: Secondary | ICD-10-CM | POA: Insufficient documentation

## 2020-02-04 LAB — CBC WITH DIFFERENTIAL/PLATELET
Abs Immature Granulocytes: 0.02 10*3/uL (ref 0.00–0.07)
Basophils Absolute: 0 10*3/uL (ref 0.0–0.1)
Basophils Relative: 0 %
Eosinophils Absolute: 0 10*3/uL (ref 0.0–0.5)
Eosinophils Relative: 0 %
HCT: 28.8 % — ABNORMAL LOW (ref 36.0–46.0)
Hemoglobin: 9.2 g/dL — ABNORMAL LOW (ref 12.0–15.0)
Immature Granulocytes: 0 %
Lymphocytes Relative: 17 %
Lymphs Abs: 0.8 10*3/uL (ref 0.7–4.0)
MCH: 33.7 pg (ref 26.0–34.0)
MCHC: 31.9 g/dL (ref 30.0–36.0)
MCV: 105.5 fL — ABNORMAL HIGH (ref 80.0–100.0)
Monocytes Absolute: 0.3 10*3/uL (ref 0.1–1.0)
Monocytes Relative: 5 %
Neutro Abs: 3.6 10*3/uL (ref 1.7–7.7)
Neutrophils Relative %: 78 %
Platelets: 150 10*3/uL (ref 150–400)
RBC: 2.73 MIL/uL — ABNORMAL LOW (ref 3.87–5.11)
RDW: 16.8 % — ABNORMAL HIGH (ref 11.5–15.5)
WBC: 4.7 10*3/uL (ref 4.0–10.5)
nRBC: 0 % (ref 0.0–0.2)

## 2020-02-04 LAB — COMPREHENSIVE METABOLIC PANEL
ALT: 16 U/L (ref 0–44)
AST: 21 U/L (ref 15–41)
Albumin: 4 g/dL (ref 3.5–5.0)
Alkaline Phosphatase: 75 U/L (ref 38–126)
Anion gap: 8 (ref 5–15)
BUN: 29 mg/dL — ABNORMAL HIGH (ref 8–23)
CO2: 26 mmol/L (ref 22–32)
Calcium: 8.9 mg/dL (ref 8.9–10.3)
Chloride: 101 mmol/L (ref 98–111)
Creatinine, Ser: 1.25 mg/dL — ABNORMAL HIGH (ref 0.44–1.00)
GFR calc Af Amer: 48 mL/min — ABNORMAL LOW (ref >60)
GFR calc non Af Amer: 41 mL/min — ABNORMAL LOW (ref >60)
Glucose, Bld: 181 mg/dL — ABNORMAL HIGH (ref 70–99)
Potassium: 5.1 mmol/L (ref 3.5–5.1)
Sodium: 135 mmol/L (ref 135–145)
Total Bilirubin: 0.9 mg/dL (ref 0.3–1.2)
Total Protein: 6.7 g/dL (ref 6.5–8.1)

## 2020-02-04 LAB — PROTIME-INR
INR: 1 (ref 0.8–1.2)
Prothrombin Time: 12.6 seconds (ref 11.4–15.2)

## 2020-02-04 NOTE — Discharge Instructions (Addendum)
Your hemoglobin today is 9.2.  Follow-up with your hematologist and primary care physician as needed.
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
## 2020-02-04 NOTE — ED Triage Notes (Signed)
BIB EMS from [facility], EMS was told she needs a blood transfusion.

## 2020-02-04 NOTE — ED Notes (Signed)
Called report to  receiving nurse Shana at facility for pt transfer home.

## 2020-02-04 NOTE — ED Notes (Signed)
PtAR at pt bedside

## 2020-02-04 NOTE — ED Notes (Signed)
PT[facility] called for transport to facility.

## 2020-02-04 NOTE — ED Notes (Signed)
Pt resting comfortably in bed. No acute distress detected at this time
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
## 2020-02-05 LAB — TYPE AND SCREEN
ABO/RH(D): O POS
Antibody Screen: POSITIVE
DAT, IgG: NEGATIVE

## 2020-02-07 ENCOUNTER — Other Ambulatory Visit: Payer: Self-pay

## 2020-02-07 ENCOUNTER — Encounter: Payer: Self-pay | Admitting: Hematology and Oncology

## 2020-02-07 ENCOUNTER — Inpatient Hospital Stay (HOSPITAL_BASED_OUTPATIENT_CLINIC_OR_DEPARTMENT_OTHER): Payer: No Typology Code available for payment source | Admitting: Hematology and Oncology

## 2020-02-07 ENCOUNTER — Inpatient Hospital Stay: Payer: No Typology Code available for payment source | Attending: Physician Assistant

## 2020-02-07 VITALS — BP 137/82 | HR 91 | Temp 98.7°F | Resp 16 | Ht 61.0 in | Wt 91.4 lb

## 2020-02-07 DIAGNOSIS — C8580 Other specified types of non-Hodgkin lymphoma, unspecified site: Secondary | ICD-10-CM | POA: Diagnosis not present

## 2020-02-07 DIAGNOSIS — I714 Abdominal aortic aneurysm, without rupture: Secondary | ICD-10-CM | POA: Diagnosis not present

## 2020-02-07 DIAGNOSIS — Z79899 Other long term (current) drug therapy: Secondary | ICD-10-CM | POA: Insufficient documentation

## 2020-02-07 DIAGNOSIS — Z809 Family history of malignant neoplasm, unspecified: Secondary | ICD-10-CM | POA: Insufficient documentation

## 2020-02-07 DIAGNOSIS — D5911 Warm autoimmune hemolytic anemia: Secondary | ICD-10-CM | POA: Insufficient documentation

## 2020-02-07 DIAGNOSIS — Z87891 Personal history of nicotine dependence: Secondary | ICD-10-CM | POA: Insufficient documentation

## 2020-02-07 DIAGNOSIS — F0390 Unspecified dementia without behavioral disturbance: Secondary | ICD-10-CM | POA: Insufficient documentation

## 2020-02-07 DIAGNOSIS — D591 Autoimmune hemolytic anemia, unspecified: Secondary | ICD-10-CM

## 2020-02-07 DIAGNOSIS — D61818 Other pancytopenia: Secondary | ICD-10-CM | POA: Diagnosis not present

## 2020-02-07 DIAGNOSIS — C8300 Small cell B-cell lymphoma, unspecified site: Secondary | ICD-10-CM | POA: Diagnosis not present

## 2020-02-07 DIAGNOSIS — R161 Splenomegaly, not elsewhere classified: Secondary | ICD-10-CM | POA: Insufficient documentation

## 2020-02-07 DIAGNOSIS — M069 Rheumatoid arthritis, unspecified: Secondary | ICD-10-CM | POA: Insufficient documentation

## 2020-02-07 DIAGNOSIS — E785 Hyperlipidemia, unspecified: Secondary | ICD-10-CM | POA: Insufficient documentation

## 2020-02-07 DIAGNOSIS — F329 Major depressive disorder, single episode, unspecified: Secondary | ICD-10-CM | POA: Insufficient documentation

## 2020-02-07 DIAGNOSIS — E039 Hypothyroidism, unspecified: Secondary | ICD-10-CM | POA: Insufficient documentation

## 2020-02-07 LAB — CMP (CANCER CENTER ONLY)
ALT: 11 U/L (ref 0–44)
AST: 16 U/L (ref 15–41)
Albumin: 3.7 g/dL (ref 3.5–5.0)
Alkaline Phosphatase: 77 U/L (ref 38–126)
Anion gap: 7 (ref 5–15)
BUN: 26 mg/dL — ABNORMAL HIGH (ref 8–23)
CO2: 26 mmol/L (ref 22–32)
Calcium: 8.4 mg/dL — ABNORMAL LOW (ref 8.9–10.3)
Chloride: 103 mmol/L (ref 98–111)
Creatinine: 1.49 mg/dL — ABNORMAL HIGH (ref 0.44–1.00)
GFR, Est AFR Am: 39 mL/min — ABNORMAL LOW (ref >60)
GFR, Estimated: 33 mL/min — ABNORMAL LOW (ref >60)
Glucose, Bld: 95 mg/dL (ref 70–99)
Potassium: 4.1 mmol/L (ref 3.5–5.1)
Sodium: 136 mmol/L (ref 135–145)
Total Bilirubin: 0.9 mg/dL (ref 0.3–1.2)
Total Protein: 6.3 g/dL — ABNORMAL LOW (ref 6.5–8.1)

## 2020-02-07 LAB — CBC WITH DIFFERENTIAL (CANCER CENTER ONLY)
Abs Immature Granulocytes: 0 10*3/uL (ref 0.00–0.07)
Basophils Absolute: 0 10*3/uL (ref 0.0–0.1)
Basophils Relative: 0 %
Eosinophils Absolute: 0 10*3/uL (ref 0.0–0.5)
Eosinophils Relative: 0 %
HCT: 25.1 % — ABNORMAL LOW (ref 36.0–46.0)
Hemoglobin: 8.2 g/dL — ABNORMAL LOW (ref 12.0–15.0)
Immature Granulocytes: 0 %
Lymphocytes Relative: 22 %
Lymphs Abs: 0.9 10*3/uL (ref 0.7–4.0)
MCH: 34.5 pg — ABNORMAL HIGH (ref 26.0–34.0)
MCHC: 32.7 g/dL (ref 30.0–36.0)
MCV: 105.5 fL — ABNORMAL HIGH (ref 80.0–100.0)
Monocytes Absolute: 0.2 10*3/uL (ref 0.1–1.0)
Monocytes Relative: 6 %
Neutro Abs: 3 10*3/uL (ref 1.7–7.7)
Neutrophils Relative %: 72 %
Platelet Count: 117 10*3/uL — ABNORMAL LOW (ref 150–400)
RBC: 2.38 MIL/uL — ABNORMAL LOW (ref 3.87–5.11)
RDW: 16.6 % — ABNORMAL HIGH (ref 11.5–15.5)
WBC Count: 4.1 10*3/uL (ref 4.0–10.5)
nRBC: 0 % (ref 0.0–0.2)

## 2020-02-07 LAB — LACTATE DEHYDROGENASE: LDH: 604 U/L — ABNORMAL HIGH (ref 98–192)
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
## 2020-02-08 ENCOUNTER — Telehealth: Payer: Self-pay | Admitting: Hematology and Oncology

## 2020-02-08 NOTE — Telephone Encounter (Signed)
Scheduled per 04/22 los, called patient regarding appointments and no voicemail was set up. Calender will be mailed.

## 2020-02-12 DIAGNOSIS — Z9181 History of falling: Secondary | ICD-10-CM | POA: Diagnosis not present

## 2020-02-12 DIAGNOSIS — D61818 Other pancytopenia: Secondary | ICD-10-CM | POA: Diagnosis not present

## 2020-02-12 DIAGNOSIS — F329 Major depressive disorder, single episode, unspecified: Secondary | ICD-10-CM | POA: Diagnosis not present

## 2020-02-12 DIAGNOSIS — M069 Rheumatoid arthritis, unspecified: Secondary | ICD-10-CM | POA: Diagnosis not present

## 2020-02-12 DIAGNOSIS — E039 Hypothyroidism, unspecified: Secondary | ICD-10-CM | POA: Diagnosis not present

## 2020-02-12 DIAGNOSIS — E785 Hyperlipidemia, unspecified: Secondary | ICD-10-CM | POA: Diagnosis not present

## 2020-02-12 DIAGNOSIS — K5900 Constipation, unspecified: Secondary | ICD-10-CM | POA: Diagnosis not present

## 2020-02-12 DIAGNOSIS — D5911 Warm autoimmune hemolytic anemia: Secondary | ICD-10-CM | POA: Diagnosis not present

## 2020-02-12 DIAGNOSIS — I10 Essential (primary) hypertension: Secondary | ICD-10-CM | POA: Diagnosis not present

## 2020-02-12 DIAGNOSIS — T82330D Leakage of aortic (bifurcation) graft (replacement), subsequent encounter: Secondary | ICD-10-CM | POA: Diagnosis not present

## 2020-02-12 DIAGNOSIS — D559 Anemia due to enzyme disorder, unspecified: Secondary | ICD-10-CM | POA: Diagnosis not present

## 2020-02-13 DIAGNOSIS — D61818 Other pancytopenia: Secondary | ICD-10-CM | POA: Diagnosis not present

## 2020-02-13 DIAGNOSIS — M069 Rheumatoid arthritis, unspecified: Secondary | ICD-10-CM | POA: Diagnosis not present

## 2020-02-13 DIAGNOSIS — D5911 Warm autoimmune hemolytic anemia: Secondary | ICD-10-CM | POA: Diagnosis not present

## 2020-02-13 DIAGNOSIS — I10 Essential (primary) hypertension: Secondary | ICD-10-CM | POA: Diagnosis not present

## 2020-02-13 DIAGNOSIS — K5900 Constipation, unspecified: Secondary | ICD-10-CM | POA: Diagnosis not present

## 2020-02-13 DIAGNOSIS — T82330D Leakage of aortic (bifurcation) graft (replacement), subsequent encounter: Secondary | ICD-10-CM | POA: Diagnosis not present

## 2020-02-19 DIAGNOSIS — D5911 Warm autoimmune hemolytic anemia: Secondary | ICD-10-CM | POA: Diagnosis not present

## 2020-02-19 DIAGNOSIS — K5900 Constipation, unspecified: Secondary | ICD-10-CM | POA: Diagnosis not present

## 2020-02-19 DIAGNOSIS — T82330D Leakage of aortic (bifurcation) graft (replacement), subsequent encounter: Secondary | ICD-10-CM | POA: Diagnosis not present

## 2020-02-19 DIAGNOSIS — I10 Essential (primary) hypertension: Secondary | ICD-10-CM | POA: Diagnosis not present

## 2020-02-19 DIAGNOSIS — M069 Rheumatoid arthritis, unspecified: Secondary | ICD-10-CM | POA: Diagnosis not present

## 2020-02-19 DIAGNOSIS — D61818 Other pancytopenia: Secondary | ICD-10-CM | POA: Diagnosis not present

## 2020-02-20 DIAGNOSIS — I10 Essential (primary) hypertension: Secondary | ICD-10-CM | POA: Diagnosis not present

## 2020-02-20 DIAGNOSIS — M069 Rheumatoid arthritis, unspecified: Secondary | ICD-10-CM | POA: Diagnosis not present

## 2020-02-20 DIAGNOSIS — K5900 Constipation, unspecified: Secondary | ICD-10-CM | POA: Diagnosis not present

## 2020-02-20 DIAGNOSIS — D61818 Other pancytopenia: Secondary | ICD-10-CM | POA: Diagnosis not present

## 2020-02-20 DIAGNOSIS — D5911 Warm autoimmune hemolytic anemia: Secondary | ICD-10-CM | POA: Diagnosis not present

## 2020-02-20 DIAGNOSIS — T82330D Leakage of aortic (bifurcation) graft (replacement), subsequent encounter: Secondary | ICD-10-CM | POA: Diagnosis not present

## 2020-02-22 ENCOUNTER — Other Ambulatory Visit: Payer: Self-pay | Admitting: Hematology and Oncology

## 2020-02-22 DIAGNOSIS — D61818 Other pancytopenia: Secondary | ICD-10-CM | POA: Diagnosis not present

## 2020-02-22 DIAGNOSIS — M069 Rheumatoid arthritis, unspecified: Secondary | ICD-10-CM | POA: Diagnosis not present

## 2020-02-22 DIAGNOSIS — D5911 Warm autoimmune hemolytic anemia: Secondary | ICD-10-CM | POA: Diagnosis not present

## 2020-02-22 DIAGNOSIS — C8307 Small cell B-cell lymphoma, spleen: Secondary | ICD-10-CM

## 2020-02-22 DIAGNOSIS — T82330D Leakage of aortic (bifurcation) graft (replacement), subsequent encounter: Secondary | ICD-10-CM | POA: Diagnosis not present

## 2020-02-22 DIAGNOSIS — I10 Essential (primary) hypertension: Secondary | ICD-10-CM | POA: Diagnosis not present

## 2020-02-22 DIAGNOSIS — K5900 Constipation, unspecified: Secondary | ICD-10-CM | POA: Diagnosis not present

## 2020-02-22 NOTE — Progress Notes (Signed)
START ON PATHWAY REGIMEN - Lymphoma and CLL     Administer weekly:     Rituximab-xxxx   **Always confirm dose/schedule in your pharmacy ordering system**  Patient Characteristics: Marginal Zone Lymphoma, Systemic, First Line, Symptomatic Disease Type: Marginal Zone Lymphoma Disease Type: Not Applicable Disease Type: Not Applicable Localized or Systemic Disease<= Systemic [place] Stage: III Line of Therapy: First Line Asymptomatic or Symptomatic<= Symptomatic Intent of Therapy: Curative Intent, Discussed with Patient

## 2020-02-25 DIAGNOSIS — D61818 Other pancytopenia: Secondary | ICD-10-CM | POA: Diagnosis not present

## 2020-02-25 DIAGNOSIS — T82330D Leakage of aortic (bifurcation) graft (replacement), subsequent encounter: Secondary | ICD-10-CM | POA: Diagnosis not present

## 2020-02-25 DIAGNOSIS — D5911 Warm autoimmune hemolytic anemia: Secondary | ICD-10-CM | POA: Diagnosis not present

## 2020-02-25 DIAGNOSIS — K5900 Constipation, unspecified: Secondary | ICD-10-CM | POA: Diagnosis not present

## 2020-02-25 DIAGNOSIS — I10 Essential (primary) hypertension: Secondary | ICD-10-CM | POA: Diagnosis not present

## 2020-02-25 DIAGNOSIS — M069 Rheumatoid arthritis, unspecified: Secondary | ICD-10-CM | POA: Diagnosis not present

## 2020-02-27 DIAGNOSIS — D5911 Warm autoimmune hemolytic anemia: Secondary | ICD-10-CM | POA: Diagnosis not present

## 2020-02-27 DIAGNOSIS — I10 Essential (primary) hypertension: Secondary | ICD-10-CM | POA: Diagnosis not present

## 2020-02-27 DIAGNOSIS — M069 Rheumatoid arthritis, unspecified: Secondary | ICD-10-CM | POA: Diagnosis not present

## 2020-02-27 DIAGNOSIS — T82330D Leakage of aortic (bifurcation) graft (replacement), subsequent encounter: Secondary | ICD-10-CM | POA: Diagnosis not present

## 2020-02-27 DIAGNOSIS — D61818 Other pancytopenia: Secondary | ICD-10-CM | POA: Diagnosis not present

## 2020-02-27 DIAGNOSIS — K5900 Constipation, unspecified: Secondary | ICD-10-CM | POA: Diagnosis not present

## 2020-03-02 ENCOUNTER — Other Ambulatory Visit: Payer: Self-pay | Admitting: Hematology and Oncology

## 2020-03-02 DIAGNOSIS — C8307 Small cell B-cell lymphoma, spleen: Secondary | ICD-10-CM

## 2020-03-03 ENCOUNTER — Inpatient Hospital Stay: Payer: Medicare Other | Attending: Physician Assistant

## 2020-03-03 ENCOUNTER — Other Ambulatory Visit: Payer: Self-pay | Admitting: *Deleted

## 2020-03-03 ENCOUNTER — Inpatient Hospital Stay: Payer: Medicare Other

## 2020-03-03 ENCOUNTER — Other Ambulatory Visit: Payer: Self-pay

## 2020-03-03 ENCOUNTER — Inpatient Hospital Stay (HOSPITAL_BASED_OUTPATIENT_CLINIC_OR_DEPARTMENT_OTHER): Payer: Medicare Other | Admitting: Hematology and Oncology

## 2020-03-03 VITALS — BP 132/87 | HR 73 | Temp 98.3°F | Resp 18 | Ht 61.0 in | Wt 97.2 lb

## 2020-03-03 DIAGNOSIS — D5911 Warm autoimmune hemolytic anemia: Secondary | ICD-10-CM | POA: Insufficient documentation

## 2020-03-03 DIAGNOSIS — C8307 Small cell B-cell lymphoma, spleen: Secondary | ICD-10-CM

## 2020-03-03 DIAGNOSIS — Z5112 Encounter for antineoplastic immunotherapy: Secondary | ICD-10-CM | POA: Diagnosis not present

## 2020-03-03 DIAGNOSIS — F0390 Unspecified dementia without behavioral disturbance: Secondary | ICD-10-CM | POA: Diagnosis not present

## 2020-03-03 DIAGNOSIS — M2550 Pain in unspecified joint: Secondary | ICD-10-CM | POA: Diagnosis not present

## 2020-03-03 DIAGNOSIS — C8300 Small cell B-cell lymphoma, unspecified site: Secondary | ICD-10-CM | POA: Insufficient documentation

## 2020-03-03 DIAGNOSIS — M549 Dorsalgia, unspecified: Secondary | ICD-10-CM | POA: Diagnosis not present

## 2020-03-03 DIAGNOSIS — T451X5A Adverse effect of antineoplastic and immunosuppressive drugs, initial encounter: Secondary | ICD-10-CM | POA: Insufficient documentation

## 2020-03-03 LAB — CBC WITH DIFFERENTIAL (CANCER CENTER ONLY)
Abs Immature Granulocytes: 0.01 10*3/uL (ref 0.00–0.07)
Basophils Absolute: 0 10*3/uL (ref 0.0–0.1)
Basophils Relative: 0 %
Eosinophils Absolute: 0 10*3/uL (ref 0.0–0.5)
Eosinophils Relative: 1 %
HCT: 20.1 % — ABNORMAL LOW (ref 36.0–46.0)
Hemoglobin: 6.1 g/dL — CL (ref 12.0–15.0)
Immature Granulocytes: 0 %
Lymphocytes Relative: 20 %
Lymphs Abs: 0.6 10*3/uL — ABNORMAL LOW (ref 0.7–4.0)
MCH: 33.7 pg (ref 26.0–34.0)
MCHC: 30.3 g/dL (ref 30.0–36.0)
MCV: 111 fL — ABNORMAL HIGH (ref 80.0–100.0)
Monocytes Absolute: 0.4 10*3/uL (ref 0.1–1.0)
Monocytes Relative: 15 %
Neutro Abs: 1.8 10*3/uL (ref 1.7–7.7)
Neutrophils Relative %: 64 %
Platelet Count: 128 10*3/uL — ABNORMAL LOW (ref 150–400)
RBC: 1.81 MIL/uL — ABNORMAL LOW (ref 3.87–5.11)
RDW: 13.8 % (ref 11.5–15.5)
WBC Count: 2.8 10*3/uL — ABNORMAL LOW (ref 4.0–10.5)
nRBC: 0 % (ref 0.0–0.2)

## 2020-03-03 LAB — CMP (CANCER CENTER ONLY)
ALT: 8 U/L (ref 0–44)
AST: 21 U/L (ref 15–41)
Albumin: 3.6 g/dL (ref 3.5–5.0)
Alkaline Phosphatase: 106 U/L (ref 38–126)
Anion gap: 9 (ref 5–15)
BUN: 25 mg/dL — ABNORMAL HIGH (ref 8–23)
CO2: 25 mmol/L (ref 22–32)
Calcium: 8.3 mg/dL — ABNORMAL LOW (ref 8.9–10.3)
Chloride: 107 mmol/L (ref 98–111)
Creatinine: 1.34 mg/dL — ABNORMAL HIGH (ref 0.44–1.00)
GFR, Est AFR Am: 44 mL/min — ABNORMAL LOW (ref >60)
GFR, Estimated: 38 mL/min — ABNORMAL LOW (ref >60)
Glucose, Bld: 81 mg/dL (ref 70–99)
Potassium: 4.7 mmol/L (ref 3.5–5.1)
Sodium: 141 mmol/L (ref 135–145)
Total Bilirubin: 0.7 mg/dL (ref 0.3–1.2)
Total Protein: 6.2 g/dL — ABNORMAL LOW (ref 6.5–8.1)

## 2020-03-03 LAB — SAMPLE TO BLOOD BANK

## 2020-03-03 LAB — PREPARE RBC (CROSSMATCH)

## 2020-03-03 LAB — LACTATE DEHYDROGENASE: LDH: 570 U/L — ABNORMAL HIGH (ref 98–192)

## 2020-03-03 MED ORDER — ACETAMINOPHEN 325 MG PO TABS
ORAL_TABLET | ORAL | Status: AC
  Filled 2020-03-03: qty 2

## 2020-03-03 MED ORDER — ACETAMINOPHEN 325 MG PO TABS: 650.0000 mg | ORAL_TABLET | Freq: Once | ORAL | Status: DC

## 2020-03-03 MED ORDER — SODIUM CHLORIDE 0.9% IV SOLUTION
250.0000 mL | Freq: Once | INTRAVENOUS | Status: DC
  Filled 2020-03-03: qty 250

## 2020-03-03 NOTE — Progress Notes (Signed)
Patient unable to receive blood product today due to antibodies. Patient agitated and trying to leave. Patient rescheduled for 2 units the following day. Patient son brandon aware of 12:30 appt on 03/04/20 for 2 units of blood. Blood bank also aware. Informed son to make sure blue bracelet remains on patient.
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
## 2020-03-04 ENCOUNTER — Inpatient Hospital Stay: Payer: Medicare Other

## 2020-03-04 ENCOUNTER — Encounter: Payer: Self-pay | Admitting: Hematology and Oncology

## 2020-03-04 ENCOUNTER — Other Ambulatory Visit: Payer: Self-pay

## 2020-03-04 DIAGNOSIS — C8300 Small cell B-cell lymphoma, unspecified site: Secondary | ICD-10-CM | POA: Diagnosis not present

## 2020-03-04 DIAGNOSIS — Z5112 Encounter for antineoplastic immunotherapy: Secondary | ICD-10-CM | POA: Diagnosis not present

## 2020-03-04 DIAGNOSIS — C8307 Small cell B-cell lymphoma, spleen: Secondary | ICD-10-CM

## 2020-03-04 DIAGNOSIS — M2550 Pain in unspecified joint: Secondary | ICD-10-CM | POA: Diagnosis not present

## 2020-03-04 DIAGNOSIS — T451X5A Adverse effect of antineoplastic and immunosuppressive drugs, initial encounter: Secondary | ICD-10-CM | POA: Diagnosis not present

## 2020-03-04 DIAGNOSIS — D5911 Warm autoimmune hemolytic anemia: Secondary | ICD-10-CM | POA: Diagnosis not present

## 2020-03-04 DIAGNOSIS — M549 Dorsalgia, unspecified: Secondary | ICD-10-CM | POA: Diagnosis not present

## 2020-03-04 LAB — PREPARE RBC (CROSSMATCH)

## 2020-03-04 MED ORDER — ACETAMINOPHEN 325 MG PO TABS
650.0000 mg | ORAL_TABLET | Freq: Once | ORAL | Status: AC
  Administered 2020-03-04: 650 mg via ORAL

## 2020-03-04 MED ORDER — ACETAMINOPHEN 325 MG PO TABS
ORAL_TABLET | ORAL | Status: AC
  Filled 2020-03-04: qty 2

## 2020-03-04 MED ORDER — SODIUM CHLORIDE 0.9% IV SOLUTION
250.0000 mL | Freq: Once | INTRAVENOUS | Status: AC
  Administered 2020-03-04: 250 mL via INTRAVENOUS
  Filled 2020-03-04: qty 250

## 2020-03-04 NOTE — Patient Instructions (Signed)

## 2020-03-05 DIAGNOSIS — I10 Essential (primary) hypertension: Secondary | ICD-10-CM | POA: Diagnosis not present

## 2020-03-05 DIAGNOSIS — T82330D Leakage of aortic (bifurcation) graft (replacement), subsequent encounter: Secondary | ICD-10-CM | POA: Diagnosis not present

## 2020-03-05 DIAGNOSIS — M069 Rheumatoid arthritis, unspecified: Secondary | ICD-10-CM | POA: Diagnosis not present

## 2020-03-05 DIAGNOSIS — D5911 Warm autoimmune hemolytic anemia: Secondary | ICD-10-CM | POA: Diagnosis not present

## 2020-03-05 DIAGNOSIS — K5900 Constipation, unspecified: Secondary | ICD-10-CM | POA: Diagnosis not present

## 2020-03-05 DIAGNOSIS — D61818 Other pancytopenia: Secondary | ICD-10-CM | POA: Diagnosis not present

## 2020-03-05 LAB — TYPE AND SCREEN
ABO/RH(D): O POS
Antibody Screen: POSITIVE
DAT, IgG: NEGATIVE
Donor AG Type: NEGATIVE
Donor AG Type: NEGATIVE
Unit division: 0
Unit division: 0

## 2020-03-05 LAB — BPAM RBC
Blood Product Expiration Date: 202106092359
Blood Product Expiration Date: 202106102359
ISSUE DATE / TIME: 202105181252
ISSUE DATE / TIME: 202105181252
Unit Type and Rh: 5100
Unit Type and Rh: 5100

## 2020-03-06 ENCOUNTER — Other Ambulatory Visit: Payer: Medicare Other

## 2020-03-07 NOTE — Progress Notes (Unsigned)
Pharmacist Chemotherapy Monitoring - Initial Assessment    Anticipated start date: 03/10/20   Regimen:  . Are orders appropriate based on the patient's diagnosis, regimen, and cycle? Yes . Does the plan date match the patient's scheduled date? Yes . Is the sequencing of drugs appropriate? Yes . Are the premedications appropriate for the patient's regimen? Yes . Prior Authorization for treatment is: Approved o If applicable, is the correct biosimilar selected based on the patient's insurance? yes  Organ Function and Labs: Marland Kitchen Are dose adjustments needed based on the patient's renal function, hepatic function, or hematologic function? No . Are appropriate labs ordered prior to the start of patient's treatment? Yes . Other organ system assessment, if indicated: N/A . The following baseline labs, if indicated, have been ordered: rituximab: baseline Hepatitis B labs  Dose Assessment: . Are the drug doses appropriate? Yes . Are the following correct: o Drug concentrations Yes o IV fluid compatible with drug Yes o Administration routes Yes o Timing of therapy Yes . If applicable, does the patient have documented access for treatment and/or plans for port-a-cath placement? not applicable . If applicable, have lifetime cumulative doses been properly documented and assessed? not applicable Lifetime Dose Tracking  No doses have been documented on this patient for the following tracked chemicals: Doxorubicin, Epirubicin, Idarubicin, Daunorubicin, Mitoxantrone, Bleomycin, Oxaliplatin, Carboplatin, Liposomal Doxorubicin  o   Toxicity Monitoring/Prevention: . The patient has the following take home antiemetics prescribed: N/A . The patient has the following take home medications prescribed: N/A . Medication allergies and previous infusion related reactions, if applicable, have been reviewed and addressed. Yes . The patient's current medication list has been assessed for drug-drug interactions with  their chemotherapy regimen. {No/  **:31982:o} significant drug-drug interactions were identified on review.  Order Review: . Are the treatment plan orders signed? {yes/no:20286} . Is the patient scheduled to see a provider prior to their treatment? {yes/no:20286}  I verify that I have reviewed each item in the above checklist and answered each question accordingly.  Norwood Levo [facility] 03/07/2020 1:31 PM

## 2020-03-09 ENCOUNTER — Other Ambulatory Visit: Payer: Self-pay | Admitting: Hematology and Oncology

## 2020-03-10 ENCOUNTER — Other Ambulatory Visit: Payer: Self-pay

## 2020-03-10 ENCOUNTER — Inpatient Hospital Stay (HOSPITAL_BASED_OUTPATIENT_CLINIC_OR_DEPARTMENT_OTHER): Payer: Medicare Other | Admitting: Medical

## 2020-03-10 ENCOUNTER — Inpatient Hospital Stay: Payer: Medicare Other

## 2020-03-10 VITALS — BP 121/69 | HR 103 | Temp 100.4°F | Resp 18

## 2020-03-10 DIAGNOSIS — Z5112 Encounter for antineoplastic immunotherapy: Secondary | ICD-10-CM | POA: Diagnosis not present

## 2020-03-10 DIAGNOSIS — M2550 Pain in unspecified joint: Secondary | ICD-10-CM | POA: Diagnosis not present

## 2020-03-10 DIAGNOSIS — D591 Autoimmune hemolytic anemia, unspecified: Secondary | ICD-10-CM

## 2020-03-10 DIAGNOSIS — C8307 Small cell B-cell lymphoma, spleen: Secondary | ICD-10-CM

## 2020-03-10 DIAGNOSIS — T8090XA Unspecified complication following infusion and therapeutic injection, initial encounter: Secondary | ICD-10-CM

## 2020-03-10 DIAGNOSIS — C8300 Small cell B-cell lymphoma, unspecified site: Secondary | ICD-10-CM | POA: Diagnosis not present

## 2020-03-10 DIAGNOSIS — T451X5A Adverse effect of antineoplastic and immunosuppressive drugs, initial encounter: Secondary | ICD-10-CM | POA: Diagnosis not present

## 2020-03-10 DIAGNOSIS — D5911 Warm autoimmune hemolytic anemia: Secondary | ICD-10-CM | POA: Diagnosis not present

## 2020-03-10 DIAGNOSIS — M549 Dorsalgia, unspecified: Secondary | ICD-10-CM | POA: Diagnosis not present

## 2020-03-10 MED ORDER — FAMOTIDINE IN NACL 20-0.9 MG/50ML-% IV SOLN
20.0000 mg | Freq: Once | INTRAVENOUS | Status: AC | PRN
  Administered 2020-03-10: 20 mg via INTRAVENOUS

## 2020-03-10 MED ORDER — DIPHENHYDRAMINE HCL 25 MG PO CAPS
ORAL_CAPSULE | ORAL | Status: AC
  Filled 2020-03-10: qty 2

## 2020-03-10 MED ORDER — ACETAMINOPHEN 325 MG PO TABS
ORAL_TABLET | ORAL | Status: AC
  Filled 2020-03-10: qty 2

## 2020-03-10 MED ORDER — MORPHINE SULFATE (PF) 4 MG/ML IV SOLN
INTRAVENOUS | Status: AC
  Filled 2020-03-10: qty 1

## 2020-03-10 MED ORDER — LORAZEPAM 2 MG/ML IJ SOLN
0.5000 mg | Freq: Once | INTRAMUSCULAR | Status: AC
  Administered 2020-03-10: 0.5 mg via INTRAVENOUS

## 2020-03-10 MED ORDER — ACETAMINOPHEN 325 MG PO TABS
650.0000 mg | ORAL_TABLET | Freq: Once | ORAL | Status: AC
  Administered 2020-03-10: 650 mg via ORAL

## 2020-03-10 MED ORDER — SODIUM CHLORIDE 0.9 % IV SOLN
Freq: Once | INTRAVENOUS | Status: AC
  Filled 2020-03-10: qty 250

## 2020-03-10 MED ORDER — DIPHENHYDRAMINE HCL 25 MG PO CAPS
50.0000 mg | ORAL_CAPSULE | Freq: Once | ORAL | Status: AC
  Administered 2020-03-10: 50 mg via ORAL

## 2020-03-10 MED ORDER — LORAZEPAM 2 MG/ML IJ SOLN
INTRAMUSCULAR | Status: AC
  Filled 2020-03-10: qty 1

## 2020-03-10 MED ORDER — SODIUM CHLORIDE 0.9 % IV SOLN
375.0000 mg/m2 | Freq: Once | INTRAVENOUS | Status: AC
  Administered 2020-03-10: 500 mg via INTRAVENOUS
  Filled 2020-03-10: qty 50

## 2020-03-10 MED ORDER — MORPHINE SULFATE 10 MG/ML IJ SOLN
1.0000 mg | Freq: Once | INTRAMUSCULAR | Status: AC
  Administered 2020-03-10: 1 mg via INTRAVENOUS
  Filled 2020-03-10: qty 1

## 2020-03-10 NOTE — Patient Instructions (Signed)
Rituximab injection What is this medicine? RITUXIMAB (ri TUX i mab) is a monoclonal antibody. It is used to treat certain types of cancer like non-Hodgkin lymphoma and chronic lymphocytic leukemia. It is also used to treat rheumatoid arthritis, granulomatosis with polyangiitis (or Wegener's granulomatosis), microscopic polyangiitis, and pemphigus vulgaris. This medicine may be used for other purposes; ask your health care provider or pharmacist if you have questions. COMMON BRAND NAME(S): Rituxan, RUXIENCE What should I tell my health care provider before I take this medicine? They need to know if you have any of these conditions:  heart disease  infection (especially a virus infection such as hepatitis B, chickenpox, cold sores, or herpes)  immune system problems  irregular heartbeat  kidney disease  low blood counts, like low white cell, platelet, or red cell counts  lung or breathing disease, like asthma  recently received or scheduled to receive a vaccine  an unusual or allergic reaction to rituximab, other medicines, foods, dyes, or preservatives  pregnant or trying to get pregnant  breast-feeding How should I use this medicine? This medicine is for infusion into a vein. It is administered in a hospital or clinic by a specially trained health care professional. A special MedGuide will be given to you by the pharmacist with each prescription and refill. Be sure to read this information carefully each time. Talk to your pediatrician regarding the use of this medicine in children. This medicine is not approved for use in children. Overdosage: If you think you have taken too much of this medicine contact a poison control center or emergency room at once. NOTE: This medicine is only for you. Do not share this medicine with others. What if I miss a dose? It is important not to miss a dose. Call your doctor or health care professional if you are unable to keep an appointment. What  may interact with this medicine?  cisplatin  live virus vaccines This list may not describe all possible interactions. Give your health care provider a list of all the medicines, herbs, non-prescription drugs, or dietary supplements you use. Also tell them if you smoke, drink alcohol, or use illegal drugs. Some items may interact with your medicine. What should I watch for while using this medicine? Your condition will be monitored carefully while you are receiving this medicine. You may need blood work done while you are taking this medicine. This medicine can cause serious allergic reactions. To reduce your risk you may need to take medicine before treatment with this medicine. Take your medicine as directed. In some patients, this medicine may cause a serious brain infection that may cause death. If you have any problems seeing, thinking, speaking, walking, or standing, tell your healthcare professional right away. If you cannot reach your healthcare professional, urgently seek other source of medical care. Call your doctor or health care professional for advice if you get a fever, chills or sore throat, or other symptoms of a cold or flu. Do not treat yourself. This drug decreases your body's ability to fight infections. Try to avoid being around people who are sick. Do not become pregnant while taking this medicine or for at least 12 months after stopping it. Women should inform their doctor if they wish to become pregnant or think they might be pregnant. There is a potential for serious side effects to an unborn child. Talk to your health care professional or pharmacist for more information. Do not breast-feed an infant while taking this medicine or for at   least 6 months after stopping it. What side effects may I notice from receiving this medicine? Side effects that you should report to your doctor or health care professional as soon as possible:  allergic reactions like skin rash, itching or  hives; swelling of the face, lips, or tongue  breathing problems  chest pain  changes in vision  diarrhea  headache with fever, neck stiffness, sensitivity to light, nausea, or confusion  fast, irregular heartbeat  loss of memory  low blood counts - this medicine may decrease the number of white blood cells, red blood cells and platelets. You may be at increased risk for infections and bleeding.  mouth sores  problems with balance, talking, or walking  redness, blistering, peeling or loosening of the skin, including inside the mouth  signs of infection - fever or chills, cough, sore throat, pain or difficulty passing urine  signs and symptoms of kidney injury like trouble passing urine or change in the amount of urine  signs and symptoms of liver injury like dark yellow or brown urine; general ill feeling or flu-like symptoms; light-colored stools; loss of appetite; nausea; right upper belly pain; unusually weak or tired; yellowing of the eyes or skin  signs and symptoms of low blood pressure like dizziness; feeling faint or lightheaded, falls; unusually weak or tired  stomach pain  swelling of the ankles, feet, hands  unusual bleeding or bruising  vomiting Side effects that usually do not require medical attention (report to your doctor or health care professional if they continue or are bothersome):  headache  joint pain  muscle cramps or muscle pain  nausea  tiredness This list may not describe all possible side effects. Call your doctor for medical advice about side effects. You may report side effects to FDA at 1-800-FDA-1088. Where should I keep my medicine? This drug is given in a hospital or clinic and will not be stored at home. NOTE: This sheet is a summary. It may not cover all possible information. If you have questions about this medicine, talk to your doctor, pharmacist, or health care provider.  2020 Elsevier/Gold Standard (2018-11-15  22:01:36)  

## 2020-03-10 NOTE — Progress Notes (Unsigned)
1105: Patient awoke from sleep complaining of chest pain.  Rituxan infusion stopped and Normal saline started. Sandi Mealy, PA notified. Patient then started complaining of pain all over and appeared agitated. Verbal order received to give patient IV pepcid, IV Morphine, and IV ativan (see MAR).  Patient's pain started to subside and patient appeared to be more calm.  OK given to restart rituxan infusion at 1150.   1155: Rituxan infusion restarted.  1220: Spoke to patient's son Lanny Hurst on the phone and informed of reaction. Requested son to come to infusion room to sit with patient.  Patient has been sleeping but will wake up confused. Patient has a baseline of dementia. Son stated he will come and sit with patient.  1330: Patient's son arrived and sat with patient. Patient still sleeping but wakes up confused.  1505: Rituxan infusion complete. Patient VSS, except patient's temp has increased. Sandi Mealy, PA notified. Ok to give PO tylenol 650mg  (see MAR). Per Sandi Mealy, PA, patient ok to go home after she receives tylenol.  Informed son of patient's temp and tylenol administration. Instructed son to check patient's temp this evening and monitor patient.  If patient's temp increases to above 100.5, instructed son to call [phone]. Son verbalized understanding.

## 2020-03-11 ENCOUNTER — Telehealth: Payer: Self-pay | Admitting: *Deleted

## 2020-03-11 NOTE — Telephone Encounter (Signed)
Called son, Kevin's mobile # & left message to call back regarding how mother is doing post rituxan yesterday.

## 2020-03-11 NOTE — Telephone Encounter (Signed)
-----   Message from Teodoro Spray, RN sent at 03/11/2020  8:49 AM EDT ----- Regarding: Dr. Lorenso Courier 1st time chemo Dr. Lorenso Courier patient first time rituxan on 03/10/20. Patient had a reaction but was able to finish infusion ok. Patient has dementia. Please call son for follow-up.  Thank you.

## 2020-03-12 DIAGNOSIS — K5900 Constipation, unspecified: Secondary | ICD-10-CM | POA: Diagnosis not present

## 2020-03-12 DIAGNOSIS — D61818 Other pancytopenia: Secondary | ICD-10-CM | POA: Diagnosis not present

## 2020-03-12 DIAGNOSIS — T82330D Leakage of aortic (bifurcation) graft (replacement), subsequent encounter: Secondary | ICD-10-CM | POA: Diagnosis not present

## 2020-03-12 DIAGNOSIS — I10 Essential (primary) hypertension: Secondary | ICD-10-CM | POA: Diagnosis not present

## 2020-03-12 DIAGNOSIS — M069 Rheumatoid arthritis, unspecified: Secondary | ICD-10-CM | POA: Diagnosis not present

## 2020-03-12 DIAGNOSIS — D5911 Warm autoimmune hemolytic anemia: Secondary | ICD-10-CM | POA: Diagnosis not present

## 2020-03-12 NOTE — Progress Notes (Signed)
Pharmacist Chemotherapy Monitoring - Follow Up Assessment    I verify that I have reviewed each item in the below checklist:  . Regimen for the patient is scheduled for the appropriate day and plan matches scheduled date. Marland Kitchen Appropriate non-routine labs are ordered dependent on drug ordered. . If applicable, additional medications reviewed and ordered per protocol based on lifetime cumulative doses and/or treatment regimen.   Plan for follow-up and/or issues identified: No . I-vent associated with next due treatment: No . MD and/or nursing notified: No  Philomena Course 03/12/2020 8:47 AM

## 2020-03-12 NOTE — Progress Notes (Addendum)
    DATE:  05/245/2021                                          X  CHEMO/IMMUNOTHERAPY REACTION           MD:  Dr. Narda Rutherford   AGENT/BLOOD PRODUCT RECEIVING TODAY:               Ruxience   AGENT/BLOOD PRODUCT RECEIVING IMMEDIATELY PRIOR TO REACTION:           Ruxience   VS: BP:      138/86   P:        69       SPO2:        100% on room air  T: 97.6                 REACTION(S):            Joint pain and back pain   PREMEDS:      Tylenol 650 mg p.o. x1 and Benadryl 50 mg p.o. x1    INTERVENTION: Ruxience was paused and the patient was given Pepcid 20 mg IV x1, morphine sulfate 1 mg IV x1, and Ativan 0.5 mg x 1.   Review of Systems  Review of Systems  Constitutional: Negative for chills, diaphoresis and fever.  Musculoskeletal: Positive for arthralgias, back pain and myalgias.  Psychiatric/Behavioral: Positive for confusion.     Physical Exam  Physical Exam Constitutional:      General: She is in acute distress.     Appearance: She is not ill-appearing or diaphoretic.     Comments: The patient is a 79 year old female with dementia who was agitated.  HENT:     Head: Normocephalic and atraumatic.  Eyes:     General: No scleral icterus.       Right eye: No discharge.        Left eye: No discharge.     Conjunctiva/sclera: Conjunctivae normal.  Cardiovascular:     Rate and Rhythm: Normal rate and regular rhythm.  Pulmonary:     Effort: Pulmonary effort is normal. No respiratory distress.     Breath sounds: No wheezing, rhonchi or rales.  Skin:    General: Skin is warm and dry.     Coloration: Skin is not pale.     Findings: No erythema or rash.  Psychiatric:     Comments: The patient has dementia and was agitated.     OUTCOME:                 The patient's back and shoulder pain abated after she was given Pepcid 20 mg IV x1, morphine sulfate 1 mg IV x1 and Ativan 0.5 mg IV x1.  Ruxience was restarted and completed without further issues of concern.  It may be best that  the patient received Haldol rather than Ativan for any future events given her age and history of dementia.   This was discussed with Dr. Narda Rutherford.   Sandi Mealy, MHS, PA-C

## 2020-03-13 DIAGNOSIS — I10 Essential (primary) hypertension: Secondary | ICD-10-CM | POA: Diagnosis not present

## 2020-03-18 ENCOUNTER — Other Ambulatory Visit: Payer: Self-pay | Admitting: Hematology and Oncology

## 2020-03-18 DIAGNOSIS — C8307 Small cell B-cell lymphoma, spleen: Secondary | ICD-10-CM
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
## 2020-03-19 ENCOUNTER — Inpatient Hospital Stay: Payer: Medicare Other | Attending: Physician Assistant | Admitting: Hematology and Oncology

## 2020-03-19 ENCOUNTER — Other Ambulatory Visit: Payer: Self-pay

## 2020-03-19 ENCOUNTER — Other Ambulatory Visit: Payer: Self-pay | Admitting: Hematology and Oncology

## 2020-03-19 ENCOUNTER — Inpatient Hospital Stay: Payer: Medicare Other

## 2020-03-19 VITALS — BP 110/63 | HR 74 | Temp 98.1°F | Resp 17

## 2020-03-19 VITALS — BP 142/74 | HR 84 | Temp 97.5°F | Resp 18 | Ht 61.0 in | Wt 94.6 lb

## 2020-03-19 DIAGNOSIS — M069 Rheumatoid arthritis, unspecified: Secondary | ICD-10-CM | POA: Diagnosis not present

## 2020-03-19 DIAGNOSIS — M549 Dorsalgia, unspecified: Secondary | ICD-10-CM | POA: Diagnosis not present

## 2020-03-19 DIAGNOSIS — C8307 Small cell B-cell lymphoma, spleen: Secondary | ICD-10-CM

## 2020-03-19 DIAGNOSIS — F0390 Unspecified dementia without behavioral disturbance: Secondary | ICD-10-CM | POA: Insufficient documentation

## 2020-03-19 DIAGNOSIS — D589 Hereditary hemolytic anemia, unspecified: Secondary | ICD-10-CM | POA: Diagnosis not present

## 2020-03-19 DIAGNOSIS — K5900 Constipation, unspecified: Secondary | ICD-10-CM | POA: Diagnosis not present

## 2020-03-19 DIAGNOSIS — E039 Hypothyroidism, unspecified: Secondary | ICD-10-CM | POA: Insufficient documentation

## 2020-03-19 DIAGNOSIS — E785 Hyperlipidemia, unspecified: Secondary | ICD-10-CM | POA: Diagnosis not present

## 2020-03-19 DIAGNOSIS — F329 Major depressive disorder, single episode, unspecified: Secondary | ICD-10-CM | POA: Diagnosis not present

## 2020-03-19 DIAGNOSIS — I10 Essential (primary) hypertension: Secondary | ICD-10-CM | POA: Diagnosis not present

## 2020-03-19 DIAGNOSIS — D5911 Warm autoimmune hemolytic anemia: Secondary | ICD-10-CM | POA: Diagnosis not present

## 2020-03-19 DIAGNOSIS — Z5112 Encounter for antineoplastic immunotherapy: Secondary | ICD-10-CM | POA: Diagnosis present

## 2020-03-19 DIAGNOSIS — Z79899 Other long term (current) drug therapy: Secondary | ICD-10-CM | POA: Diagnosis not present

## 2020-03-19 DIAGNOSIS — I714 Abdominal aortic aneurysm, without rupture: Secondary | ICD-10-CM | POA: Diagnosis not present

## 2020-03-19 DIAGNOSIS — C8300 Small cell B-cell lymphoma, unspecified site: Secondary | ICD-10-CM | POA: Insufficient documentation

## 2020-03-19 LAB — CMP (CANCER CENTER ONLY)
ALT: 12 U/L (ref 0–44)
AST: 28 U/L (ref 15–41)
Albumin: 4 g/dL (ref 3.5–5.0)
Alkaline Phosphatase: 87 U/L (ref 38–126)
Anion gap: 11 (ref 5–15)
BUN: 27 mg/dL — ABNORMAL HIGH (ref 8–23)
CO2: 22 mmol/L (ref 22–32)
Calcium: 9.1 mg/dL (ref 8.9–10.3)
Chloride: 107 mmol/L (ref 98–111)
Creatinine: 1.1 mg/dL — ABNORMAL HIGH (ref 0.44–1.00)
GFR, Est AFR Am: 56 mL/min — ABNORMAL LOW (ref >60)
GFR, Estimated: 48 mL/min — ABNORMAL LOW (ref >60)
Glucose, Bld: 81 mg/dL (ref 70–99)
Potassium: 4.4 mmol/L (ref 3.5–5.1)
Sodium: 140 mmol/L (ref 135–145)
Total Bilirubin: 1.3 mg/dL — ABNORMAL HIGH (ref 0.3–1.2)
Total Protein: 6.9 g/dL (ref 6.5–8.1)

## 2020-03-19 LAB — CBC WITH DIFFERENTIAL (CANCER CENTER ONLY)
Abs Immature Granulocytes: 0.02 10*3/uL (ref 0.00–0.07)
Basophils Absolute: 0 10*3/uL (ref 0.0–0.1)
Basophils Relative: 0 %
Eosinophils Absolute: 0 10*3/uL (ref 0.0–0.5)
Eosinophils Relative: 1 %
HCT: 25.3 % — ABNORMAL LOW (ref 36.0–46.0)
Hemoglobin: 8.1 g/dL — ABNORMAL LOW (ref 12.0–15.0)
Immature Granulocytes: 1 %
Lymphocytes Relative: 12 %
Lymphs Abs: 0.4 10*3/uL — ABNORMAL LOW (ref 0.7–4.0)
MCH: 33.1 pg (ref 26.0–34.0)
MCHC: 32 g/dL (ref 30.0–36.0)
MCV: 103.3 fL — ABNORMAL HIGH (ref 80.0–100.0)
Monocytes Absolute: 0.4 10*3/uL (ref 0.1–1.0)
Monocytes Relative: 12 %
Neutro Abs: 2.4 10*3/uL (ref 1.7–7.7)
Neutrophils Relative %: 74 %
Platelet Count: 138 10*3/uL — ABNORMAL LOW (ref 150–400)
RBC: 2.45 MIL/uL — ABNORMAL LOW (ref 3.87–5.11)
RDW: 14.6 % (ref 11.5–15.5)
WBC Count: 3.3 10*3/uL — ABNORMAL LOW (ref 4.0–10.5)
nRBC: 0 % (ref 0.0–0.2)

## 2020-03-19 LAB — SAMPLE TO BLOOD BANK

## 2020-03-19 LAB — LACTATE DEHYDROGENASE: LDH: 892 U/L — ABNORMAL HIGH (ref 98–192)

## 2020-03-19 MED ORDER — DIPHENHYDRAMINE HCL 25 MG PO CAPS
ORAL_CAPSULE | ORAL | Status: AC
  Filled 2020-03-19: qty 2

## 2020-03-19 MED ORDER — DIPHENHYDRAMINE HCL 25 MG PO CAPS
50.0000 mg | ORAL_CAPSULE | Freq: Once | ORAL | Status: AC
  Administered 2020-03-19: 50 mg via ORAL

## 2020-03-19 MED ORDER — METHYLPREDNISOLONE SODIUM SUCC 40 MG IJ SOLR
40.0000 mg | Freq: Once | INTRAMUSCULAR | Status: AC
  Administered 2020-03-19: 40 mg via INTRAVENOUS

## 2020-03-19 MED ORDER — ACETAMINOPHEN 325 MG PO TABS
ORAL_TABLET | ORAL | Status: AC
  Filled 2020-03-19: qty 2

## 2020-03-19 MED ORDER — ACETAMINOPHEN 325 MG PO TABS
650.0000 mg | ORAL_TABLET | Freq: Once | ORAL | Status: AC
  Administered 2020-03-19: 650 mg via ORAL

## 2020-03-19 MED ORDER — SODIUM CHLORIDE 0.9 % IV SOLN
Freq: Once | INTRAVENOUS | Status: AC
  Filled 2020-03-19: qty 250

## 2020-03-19 MED ORDER — SODIUM CHLORIDE 0.9 % IV SOLN
375.0000 mg/m2 | Freq: Once | INTRAVENOUS | Status: AC
  Administered 2020-03-19: 500 mg via INTRAVENOUS
  Filled 2020-03-19: qty 50

## 2020-03-19 MED ORDER — METHYLPREDNISOLONE SODIUM SUCC 40 MG IJ SOLR
INTRAMUSCULAR | Status: AC
  Filled 2020-03-19: qty 1

## 2020-03-19 NOTE — Progress Notes (Signed)
Rapid Infusion Rituximab Pharmacist Evaluation  Joanne Whitehead is a 79 y.o. female being treated with rituximab for lymphoma. This patient may not be considered for RIR.   A pharmacist has verified the patient tolerated rituximab infusions per the [facility] standard infusion protocol without grade 3-4 infusion reactions. The treatment plan will be updated to reflect RIR if the patient qualifies per the checklist below:   Age > 29 years old Yes   Clinically significant cardiovascular disease No   Circulating lymphocyte count < 5000/uL prior to cycle two Yes  Lab Results  Component Value Date   LYMPHSABS 0.4 (L) 03/19/2020    Prior documented grade 3-4 infusion reaction to rituximab Yes   Prior documented grade 1-2 infusion reaction to rituximab (If YES, Pharmacist will confirm with Physician if patient is still a candidate for RIR) N/A  Previous rituximab infusion within the past 6 months Yes   Treatment Plan updated orders to reflect RIR No    Joanne Whitehead does not meet the criteria for Rapid Infusion Rituximab. This patient is not going to be switched to rapid infusion rituximab.    Plan discussed with Dr. Lorenso Courier - patient is to receive rituximab infusion today at slower, "initial" rate and methylprednisolone to be added to premedication regimen.   Demetrius Charity, PharmD, BCPS, [place] [facility] Pharmacist Pharmacy Phone: [phone] 03/19/2020

## 2020-03-19 NOTE — Patient Instructions (Signed)
[facility] °Discharge Instructions for Patients Receiving Chemotherapy ° °Today you received the following chemotherapy agents: Rituximab ° °To help prevent nausea and vomiting after your treatment, we encourage you to take your nausea medication as directed.  °  °If you develop nausea and vomiting that is not controlled by your nausea medication, call the clinic.  ° °BELOW ARE SYMPTOMS THAT SHOULD BE REPORTED IMMEDIATELY: °· *FEVER GREATER THAN 100.5 F °· *CHILLS WITH OR WITHOUT FEVER °· NAUSEA AND VOMITING THAT IS NOT CONTROLLED WITH YOUR NAUSEA MEDICATION °· *UNUSUAL SHORTNESS OF BREATH °· *UNUSUAL BRUISING OR BLEEDING °· TENDERNESS IN MOUTH AND THROAT WITH OR WITHOUT PRESENCE OF ULCERS °· *URINARY PROBLEMS °· *BOWEL PROBLEMS °· UNUSUAL RASH °Items with * indicate a potential emergency and should be followed up as soon as possible. ° °Feel free to call the clinic should you have any questions or concerns. The clinic phone number is [phone]. ° °Please show the CHEMO ALERT CARD at check-in to the Emergency Department and triage nurse. ° ° °

## 2020-03-24 ENCOUNTER — Other Ambulatory Visit: Payer: Self-pay | Admitting: *Deleted

## 2020-03-24 ENCOUNTER — Inpatient Hospital Stay: Payer: Medicare Other

## 2020-03-24 ENCOUNTER — Other Ambulatory Visit: Payer: Self-pay

## 2020-03-24 ENCOUNTER — Other Ambulatory Visit: Payer: Self-pay | Admitting: Hematology and Oncology

## 2020-03-24 VITALS — BP 120/61 | HR 80 | Temp 98.6°F | Resp 18

## 2020-03-24 DIAGNOSIS — C8307 Small cell B-cell lymphoma, spleen: Secondary | ICD-10-CM

## 2020-03-24 DIAGNOSIS — Z5112 Encounter for antineoplastic immunotherapy: Secondary | ICD-10-CM | POA: Diagnosis not present

## 2020-03-24 LAB — CBC WITH DIFFERENTIAL (CANCER CENTER ONLY)
Abs Immature Granulocytes: 0.01 10*3/uL (ref 0.00–0.07)
Basophils Absolute: 0 10*3/uL (ref 0.0–0.1)
Basophils Relative: 0 %
Eosinophils Absolute: 0 10*3/uL (ref 0.0–0.5)
Eosinophils Relative: 0 %
HCT: 21.6 % — ABNORMAL LOW (ref 36.0–46.0)
Hemoglobin: 6.8 g/dL — CL (ref 12.0–15.0)
Immature Granulocytes: 0 %
Lymphocytes Relative: 11 %
Lymphs Abs: 0.4 10*3/uL — ABNORMAL LOW (ref 0.7–4.0)
MCH: 33.7 pg (ref 26.0–34.0)
MCHC: 31.5 g/dL (ref 30.0–36.0)
MCV: 106.9 fL — ABNORMAL HIGH (ref 80.0–100.0)
Monocytes Absolute: 0.4 10*3/uL (ref 0.1–1.0)
Monocytes Relative: 11 %
Neutro Abs: 3 10*3/uL (ref 1.7–7.7)
Neutrophils Relative %: 78 %
Platelet Count: 138 10*3/uL — ABNORMAL LOW (ref 150–400)
RBC: 2.02 MIL/uL — ABNORMAL LOW (ref 3.87–5.11)
RDW: 15.8 % — ABNORMAL HIGH (ref 11.5–15.5)
WBC Count: 3.9 10*3/uL — ABNORMAL LOW (ref 4.0–10.5)
nRBC: 0 % (ref 0.0–0.2)

## 2020-03-24 LAB — CMP (CANCER CENTER ONLY)
ALT: 8 U/L (ref 0–44)
AST: 29 U/L (ref 15–41)
Albumin: 3.9 g/dL (ref 3.5–5.0)
Alkaline Phosphatase: 91 U/L (ref 38–126)
Anion gap: 9 (ref 5–15)
BUN: 32 mg/dL — ABNORMAL HIGH (ref 8–23)
CO2: 25 mmol/L (ref 22–32)
Calcium: 9 mg/dL (ref 8.9–10.3)
Chloride: 105 mmol/L (ref 98–111)
Creatinine: 1.28 mg/dL — ABNORMAL HIGH (ref 0.44–1.00)
GFR, Est AFR Am: 46 mL/min — ABNORMAL LOW (ref >60)
GFR, Estimated: 40 mL/min — ABNORMAL LOW (ref >60)
Glucose, Bld: 85 mg/dL (ref 70–99)
Potassium: 4.5 mmol/L (ref 3.5–5.1)
Sodium: 139 mmol/L (ref 135–145)
Total Bilirubin: 1.6 mg/dL — ABNORMAL HIGH (ref 0.3–1.2)
Total Protein: 6.6 g/dL (ref 6.5–8.1)

## 2020-03-24 LAB — SAMPLE TO BLOOD BANK

## 2020-03-24 LAB — PREPARE RBC (CROSSMATCH)

## 2020-03-24 LAB — LACTATE DEHYDROGENASE: LDH: 1013 U/L — ABNORMAL HIGH (ref 98–192)

## 2020-03-24 MED ORDER — METHYLPREDNISOLONE SODIUM SUCC 40 MG IJ SOLR
INTRAMUSCULAR | Status: AC
  Filled 2020-03-24: qty 1

## 2020-03-24 MED ORDER — ACETAMINOPHEN 325 MG PO TABS
650.0000 mg | ORAL_TABLET | Freq: Once | ORAL | Status: AC
  Administered 2020-03-24: 650 mg via ORAL

## 2020-03-24 MED ORDER — SODIUM CHLORIDE 0.9 % IV SOLN
Freq: Once | INTRAVENOUS | Status: AC
  Filled 2020-03-24: qty 250

## 2020-03-24 MED ORDER — SODIUM CHLORIDE 0.9 % IV SOLN
375.0000 mg/m2 | Freq: Once | INTRAVENOUS | Status: AC
  Administered 2020-03-24: 500 mg via INTRAVENOUS
  Filled 2020-03-24: qty 50

## 2020-03-24 MED ORDER — ACETAMINOPHEN 325 MG PO TABS
ORAL_TABLET | ORAL | Status: AC
  Filled 2020-03-24: qty 2

## 2020-03-24 MED ORDER — METHYLPREDNISOLONE SODIUM SUCC 40 MG IJ SOLR
40.0000 mg | Freq: Once | INTRAMUSCULAR | Status: AC
  Administered 2020-03-24: 40 mg via INTRAVENOUS

## 2020-03-24 MED ORDER — DIPHENHYDRAMINE HCL 25 MG PO CAPS
50.0000 mg | ORAL_CAPSULE | Freq: Once | ORAL | Status: AC
  Administered 2020-03-24: 50 mg via ORAL

## 2020-03-24 MED ORDER — DIPHENHYDRAMINE HCL 25 MG PO CAPS
ORAL_CAPSULE | ORAL | Status: AC
  Filled 2020-03-24: qty 2

## 2020-03-24 NOTE — Patient Instructions (Signed)
[facility] °Discharge Instructions for Patients Receiving Chemotherapy ° °Today you received the following chemotherapy agents: Rituximab ° °To help prevent nausea and vomiting after your treatment, we encourage you to take your nausea medication as directed.  °  °If you develop nausea and vomiting that is not controlled by your nausea medication, call the clinic.  ° °BELOW ARE SYMPTOMS THAT SHOULD BE REPORTED IMMEDIATELY: °· *FEVER GREATER THAN 100.5 F °· *CHILLS WITH OR WITHOUT FEVER °· NAUSEA AND VOMITING THAT IS NOT CONTROLLED WITH YOUR NAUSEA MEDICATION °· *UNUSUAL SHORTNESS OF BREATH °· *UNUSUAL BRUISING OR BLEEDING °· TENDERNESS IN MOUTH AND THROAT WITH OR WITHOUT PRESENCE OF ULCERS °· *URINARY PROBLEMS °· *BOWEL PROBLEMS °· UNUSUAL RASH °Items with * indicate a potential emergency and should be followed up as soon as possible. ° °Feel free to call the clinic should you have any questions or concerns. The clinic phone number is [phone]. ° °Please show the CHEMO ALERT CARD at check-in to the Emergency Department and triage nurse. ° ° °

## 2020-03-26 ENCOUNTER — Other Ambulatory Visit: Payer: Self-pay

## 2020-03-26 ENCOUNTER — Other Ambulatory Visit: Payer: Self-pay | Admitting: Emergency Medicine

## 2020-03-26 ENCOUNTER — Inpatient Hospital Stay: Payer: Medicare Other

## 2020-03-26 DIAGNOSIS — Z5112 Encounter for antineoplastic immunotherapy: Secondary | ICD-10-CM | POA: Diagnosis not present

## 2020-03-26 DIAGNOSIS — C8307 Small cell B-cell lymphoma, spleen: Secondary | ICD-10-CM

## 2020-03-26 MED ORDER — ACETAMINOPHEN 325 MG PO TABS
650.0000 mg | ORAL_TABLET | Freq: Once | ORAL | Status: AC
  Administered 2020-03-26: 650 mg via ORAL

## 2020-03-26 MED ORDER — SODIUM CHLORIDE 0.9% IV SOLUTION
250.0000 mL | Freq: Once | INTRAVENOUS | Status: AC
  Administered 2020-03-26: 250 mL via INTRAVENOUS
  Filled 2020-03-26: qty 250

## 2020-03-26 MED ORDER — ACETAMINOPHEN 325 MG PO TABS
ORAL_TABLET | ORAL | Status: AC
  Filled 2020-03-26: qty 2

## 2020-03-26 NOTE — Progress Notes (Signed)
Pt received 2 units PRBCs today, tolerated well.  VSS.  Able to eat/drink during tx w/out any issues, declined to use restroom. °

## 2020-03-26 NOTE — Patient Instructions (Signed)
Blood Transfusion, Adult, Care After This sheet gives you information about how to care for yourself after your procedure. Your doctor may also give you more specific instructions. If you have problems or questions, contact your doctor. What can I expect after the procedure? After the procedure, it is common to have:  Bruising and soreness at the IV site.  A fever or chills on the day of the procedure. This may be your body's response to the new blood cells received.  A headache. Follow these instructions at home: Insertion site care      Follow instructions from your doctor about how to take care of your insertion site. This is where an IV tube was put into your vein. Make sure you: ? Wash your hands with soap and water before and after you change your bandage (dressing). If you cannot use soap and water, use hand sanitizer. ? Change your bandage as told by your doctor.  Check your insertion site every day for signs of infection. Check for: ? Redness, swelling, or pain. ? Bleeding from the site. ? Warmth. ? Pus or a bad smell. General instructions  Take over-the-counter and prescription medicines only as told by your doctor.  Rest as told by your doctor.  Go back to your normal activities as told by your doctor.  Keep all follow-up visits as told by your doctor. This is important. Contact a doctor if:  You have itching or red, swollen areas of skin (hives).  You feel worried or nervous (anxious).  You feel weak after doing your normal activities.  You have redness, swelling, warmth, or pain around the insertion site.  You have blood coming from the insertion site, and the blood does not stop with pressure.  You have pus or a bad smell coming from the insertion site. Get help right away if:  You have signs of a serious reaction. This may be coming from an allergy or the body's defense system (immune system). Signs include: ? Trouble breathing or shortness of  breath. ? Swelling of the face or feeling warm (flushed). ? Fever or chills. ? Head, chest, or back pain. ? Dark pee (urine) or blood in the pee. ? Widespread rash. ? Fast heartbeat. ? Feeling dizzy or light-headed. You may receive your blood transfusion in an outpatient setting. If so, you will be told whom to contact to report any reactions. These symptoms may be an emergency. Do not wait to see if the symptoms will go away. Get medical help right away. Call your local emergency services (911 in the [place] Do not drive yourself to the hospital. Summary  Bruising and soreness at the IV site are common.  Check your insertion site every day for signs of infection.  Rest as told by your doctor. Go back to your normal activities as told by your doctor.  Get help right away if you have signs of a serious reaction. This information is not intended to replace advice given to you by your health care provider. Make sure you discuss any questions you have with your health care provider. Document Revised: 03/29/2019 Document Reviewed: 03/29/2019 Elsevier Patient Education  2020 Elsevier Inc.     Blood Transfusion, Adult A blood transfusion is a procedure in which you receive blood or a type of blood cell (blood component) through an IV. You may need a blood transfusion when your blood level is low. This may result from a bleeding disorder, illness, injury, or surgery. The blood may come   from a donor. You may also be able to donate blood for yourself (autologous blood donation) before a planned surgery. The blood given in a transfusion is made up of different blood components. You may receive:  Red blood cells. These carry oxygen to the cells in the body.  Platelets. These help your blood to clot.  Plasma. This is the liquid part of your blood. It carries proteins and other substances throughout the body.  White blood cells. These help you fight infections. If you have hemophilia or  another clotting disorder, you may also receive other types of blood products. Tell a health care provider about:  Any blood disorders you have.  Any previous reactions you have had during a blood transfusion.  Any allergies you have.  All medicines you are taking, including vitamins, herbs, eye drops, creams, and over-the-counter medicines.  Any surgeries you have had.  Any medical conditions you have, including any recent fever or cold symptoms.  Whether you are pregnant or may be pregnant. What are the risks? Generally, this is a safe procedure. However, problems may occur.  The most common problems include: ? A mild allergic reaction, such as red, swollen areas of skin (hives) and itching. ? Fever or chills. This may be the body's response to new blood cells received. This may occur during or up to 4 hours after the transfusion.  More serious problems may include: ? Transfusion-associated circulatory overload (TACO), or too much fluid in the lungs. This may cause breathing problems. ? A serious allergic reaction, such as difficulty breathing or swelling around the face and lips. ? Transfusion-related acute lung injury (TRALI), which causes breathing difficulty and low oxygen in the blood. This can occur within hours of the transfusion or several days later. ? Iron overload. This can happen after receiving many blood transfusions over a period of time. ? Infection or virus being transmitted. This is rare because donated blood is carefully tested before it is given. ? Hemolytic transfusion reaction. This is rare. It happens when your body's defense system (immune system)tries to attack the new blood cells. Symptoms may include fever, chills, nausea, low blood pressure, and low back or chest pain. ? Transfusion-associated graft-versus-host disease (TAGVHD). This is rare. It happens when donated cells attack your body's healthy tissues. What happens before the  procedure? Medicines Ask your health care provider about:  Changing or stopping your regular medicines. This is especially important if you are taking diabetes medicines or blood thinners.  Taking medicines such as aspirin and ibuprofen. These medicines can thin your blood. Do not take these medicines unless your health care provider tells you to take them.  Taking over-the-counter medicines, vitamins, herbs, and supplements. General instructions  Follow instructions from your health care provider about eating and drinking restrictions.  You will have a blood test to determine your blood type. This is necessary to know what kind of blood your body will accept and to match it to the donor blood.  If you are going to have a planned surgery, you may be able to do an autologous blood donation. This may be done in case you need to have a transfusion.  You will have your temperature, blood pressure, and pulse monitored before the transfusion.  If you have had an allergic reaction to a transfusion in the past, you may be given medicine to help prevent a reaction. This medicine may be given to you by mouth (orally) or through an IV.  Set aside time for   the blood transfusion. This procedure generally takes 1-4 hours to complete. What happens during the procedure?   An IV will be inserted into one of your veins.  The bag of donated blood will be attached to your IV. The blood will then enter through your vein.  Your temperature, blood pressure, and pulse will be monitored regularly during the transfusion. This monitoring is done to detect early signs of a transfusion reaction.  Tell your nurse right away if you have any of these symptoms during the transfusion: ? Shortness of breath or trouble breathing. ? Chest or back pain. ? Fever or chills. ? Hives or itching.  If you have any signs or symptoms of a reaction, your transfusion will be stopped and you may be given medicine.  When the  transfusion is complete, your IV will be removed.  Pressure may be applied to the IV site for a few minutes.  A bandage (dressing)will be applied. The procedure may vary among health care providers and hospitals. What happens after the procedure?  Your temperature, blood pressure, pulse, breathing rate, and blood oxygen level will be monitored until you leave the hospital or clinic.  Your blood may be tested to see how you are responding to the transfusion.  You may be warmed with fluids or blankets to maintain a normal body temperature.  If you receive your blood transfusion in an outpatient setting, you will be told whom to contact to report any reactions. Where to find more information For more information on blood transfusions, visit the American [employer]: redcross.org Summary  A blood transfusion is a procedure in which you receive blood or a type of blood cell (blood component) through an IV.  The blood you receive may come from a donor or be donated by yourself (autologous blood donation) before a planned surgery.  The blood given in a transfusion is made up of different blood components. You may receive red blood cells, platelets, plasma, or white blood cells depending on the condition treated.  Your temperature, blood pressure, and pulse will be monitored before, during, and after the transfusion.  After the transfusion, your blood may be tested to see how your body has responded. This information is not intended to replace advice given to you by your health care provider. Make sure you discuss any questions you have with your health care provider. Document Revised: 03/29/2019 Document Reviewed: 03/29/2019 Elsevier Patient Education  2020 Elsevier Inc.   

## 2020-03-27 LAB — TYPE AND SCREEN
ABO/RH(D): O POS
Antibody Screen: POSITIVE
DAT, IgG: NEGATIVE
Donor AG Type: NEGATIVE
Donor AG Type: NEGATIVE
Unit division: 0
Unit division: 0

## 2020-03-27 LAB — BPAM RBC
Blood Product Expiration Date: 202107082359
Blood Product Expiration Date: 202107082359
ISSUE DATE / TIME: 202106091035
ISSUE DATE / TIME: 202106091035
Unit Type and Rh: 5100
Unit Type and Rh: 5100

## 2020-03-30 ENCOUNTER — Other Ambulatory Visit: Payer: Self-pay | Admitting: Hematology and Oncology

## 2020-03-30 DIAGNOSIS — C8307 Small cell B-cell lymphoma, spleen: Secondary | ICD-10-CM
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
## 2020-03-31 ENCOUNTER — Inpatient Hospital Stay: Payer: Medicare Other

## 2020-03-31 ENCOUNTER — Encounter: Payer: Self-pay | Admitting: Hematology and Oncology

## 2020-03-31 ENCOUNTER — Inpatient Hospital Stay (HOSPITAL_BASED_OUTPATIENT_CLINIC_OR_DEPARTMENT_OTHER): Payer: Medicare Other | Admitting: Hematology and Oncology

## 2020-03-31 ENCOUNTER — Other Ambulatory Visit: Payer: Self-pay

## 2020-03-31 VITALS — BP 148/67 | HR 81 | Temp 97.5°F | Resp 17 | Ht 61.0 in | Wt 95.8 lb

## 2020-03-31 VITALS — BP 109/57 | HR 71 | Temp 97.9°F | Resp 18

## 2020-03-31 DIAGNOSIS — D5911 Warm autoimmune hemolytic anemia: Secondary | ICD-10-CM | POA: Diagnosis not present

## 2020-03-31 DIAGNOSIS — Z5112 Encounter for antineoplastic immunotherapy: Secondary | ICD-10-CM | POA: Diagnosis not present

## 2020-03-31 DIAGNOSIS — C8307 Small cell B-cell lymphoma, spleen: Secondary | ICD-10-CM

## 2020-03-31 LAB — RETIC PANEL
Immature Retic Fract: 6.8 % (ref 2.3–15.9)
RBC.: 2.49 MIL/uL — ABNORMAL LOW (ref 3.87–5.11)
Retic Count, Absolute: 92.6 10*3/uL (ref 19.0–186.0)
Retic Ct Pct: 3.7 % — ABNORMAL HIGH (ref 0.4–3.1)
Reticulocyte Hemoglobin: 34 pg (ref >27.9)

## 2020-03-31 LAB — CMP (CANCER CENTER ONLY)
ALT: 7 U/L (ref 0–44)
AST: 28 U/L (ref 15–41)
Albumin: 3.9 g/dL (ref 3.5–5.0)
Alkaline Phosphatase: 81 U/L (ref 38–126)
Anion gap: 9 (ref 5–15)
BUN: 27 mg/dL — ABNORMAL HIGH (ref 8–23)
CO2: 26 mmol/L (ref 22–32)
Calcium: 9 mg/dL (ref 8.9–10.3)
Chloride: 106 mmol/L (ref 98–111)
Creatinine: 1.16 mg/dL — ABNORMAL HIGH (ref 0.44–1.00)
GFR, Est AFR Am: 52 mL/min — ABNORMAL LOW (ref >60)
GFR, Estimated: 45 mL/min — ABNORMAL LOW (ref >60)
Glucose, Bld: 88 mg/dL (ref 70–99)
Potassium: 4.4 mmol/L (ref 3.5–5.1)
Sodium: 141 mmol/L (ref 135–145)
Total Bilirubin: 1.5 mg/dL — ABNORMAL HIGH (ref 0.3–1.2)
Total Protein: 6.6 g/dL (ref 6.5–8.1)

## 2020-03-31 LAB — CBC WITH DIFFERENTIAL (CANCER CENTER ONLY)
Abs Immature Granulocytes: 0.01 10*3/uL (ref 0.00–0.07)
Basophils Absolute: 0 10*3/uL (ref 0.0–0.1)
Basophils Relative: 0 %
Eosinophils Absolute: 0 10*3/uL (ref 0.0–0.5)
Eosinophils Relative: 1 %
HCT: 25.3 % — ABNORMAL LOW (ref 36.0–46.0)
Hemoglobin: 8 g/dL — ABNORMAL LOW (ref 12.0–15.0)
Immature Granulocytes: 0 %
Lymphocytes Relative: 10 %
Lymphs Abs: 0.3 10*3/uL — ABNORMAL LOW (ref 0.7–4.0)
MCH: 32 pg (ref 26.0–34.0)
MCHC: 31.6 g/dL (ref 30.0–36.0)
MCV: 101.2 fL — ABNORMAL HIGH (ref 80.0–100.0)
Monocytes Absolute: 0.4 10*3/uL (ref 0.1–1.0)
Monocytes Relative: 12 %
Neutro Abs: 2.7 10*3/uL (ref 1.7–7.7)
Neutrophils Relative %: 77 %
Platelet Count: 116 10*3/uL — ABNORMAL LOW (ref 150–400)
RBC: 2.5 MIL/uL — ABNORMAL LOW (ref 3.87–5.11)
RDW: 18.5 % — ABNORMAL HIGH (ref 11.5–15.5)
WBC Count: 3.5 10*3/uL — ABNORMAL LOW (ref 4.0–10.5)
nRBC: 0 % (ref 0.0–0.2)

## 2020-03-31 LAB — LACTATE DEHYDROGENASE: LDH: 1094 U/L — ABNORMAL HIGH (ref 98–192)

## 2020-03-31 LAB — DIRECT ANTIGLOBULIN TEST (NOT AT ARMC)
DAT, IgG: NEGATIVE
DAT, complement: NEGATIVE

## 2020-03-31 MED ORDER — DIPHENHYDRAMINE HCL 25 MG PO CAPS
50.0000 mg | ORAL_CAPSULE | Freq: Once | ORAL | Status: AC
  Administered 2020-03-31: 50 mg via ORAL

## 2020-03-31 MED ORDER — SODIUM CHLORIDE 0.9 % IV SOLN
Freq: Once | INTRAVENOUS | Status: AC
  Filled 2020-03-31: qty 250

## 2020-03-31 MED ORDER — METHYLPREDNISOLONE SODIUM SUCC 40 MG IJ SOLR
INTRAMUSCULAR | Status: AC
  Filled 2020-03-31: qty 1

## 2020-03-31 MED ORDER — SODIUM CHLORIDE 0.9 % IV SOLN
375.0000 mg/m2 | Freq: Once | INTRAVENOUS | Status: AC
  Administered 2020-03-31: 500 mg via INTRAVENOUS
  Filled 2020-03-31: qty 50

## 2020-03-31 MED ORDER — METHYLPREDNISOLONE SODIUM SUCC 40 MG IJ SOLR
40.0000 mg | Freq: Once | INTRAMUSCULAR | Status: AC
  Administered 2020-03-31: 40 mg via INTRAVENOUS

## 2020-03-31 MED ORDER — DIPHENHYDRAMINE HCL 25 MG PO CAPS
ORAL_CAPSULE | ORAL | Status: AC
  Filled 2020-03-31: qty 2

## 2020-03-31 MED ORDER — ACETAMINOPHEN 325 MG PO TABS
ORAL_TABLET | ORAL | Status: AC
  Filled 2020-03-31: qty 2

## 2020-03-31 MED ORDER — ACETAMINOPHEN 325 MG PO TABS
650.0000 mg | ORAL_TABLET | Freq: Once | ORAL | Status: AC
  Administered 2020-03-31: 650 mg via ORAL

## 2020-03-31 NOTE — Patient Instructions (Signed)
[facility] Discharge Instructions for Patients Receiving Chemotherapy  Today you received the following chemotherapy agents:  Rituxan   To help prevent nausea and vomiting after your treatment, we encourage you to take your nausea medication as prescribed.   If you develop nausea and vomiting that is not controlled by your nausea medication, call the clinic.   BELOW ARE SYMPTOMS THAT SHOULD BE REPORTED IMMEDIATELY:  *FEVER GREATER THAN 100.5 F  *CHILLS WITH OR WITHOUT FEVER  NAUSEA AND VOMITING THAT IS NOT CONTROLLED WITH YOUR NAUSEA MEDICATION  *UNUSUAL SHORTNESS OF BREATH  *UNUSUAL BRUISING OR BLEEDING  TENDERNESS IN MOUTH AND THROAT WITH OR WITHOUT PRESENCE OF ULCERS  *URINARY PROBLEMS  *BOWEL PROBLEMS  UNUSUAL RASH Items with * indicate a potential emergency and should be followed up as soon as possible.  Feel free to call the clinic should you have any questions or concerns. The clinic phone number is [phone].  Please show the CHEMO ALERT CARD at check-in to the Emergency Department and triage nurse.   

## 2020-05-08 ENCOUNTER — Telehealth: Payer: Self-pay | Admitting: Hematology and Oncology

## 2020-05-08 NOTE — Telephone Encounter (Signed)
I attempted to schedule appointment for patient per 7/21 provider message. However, patient seemed very confused and claimed that she didn't know who the provider was or that she had ever been to the [place]. She specifically stated she didn't want an appointment scheduled, so no appointment has been scheduled at this time.

## 2020-05-08 NOTE — Telephone Encounter (Signed)
Spoke with patient's son about patient's appointments. Patient's son is the one who handles all of the patient's appointments. He is aware of patient's appointment date and time.

## 2020-05-14 ENCOUNTER — Inpatient Hospital Stay (HOSPITAL_COMMUNITY)
Admission: AD | Admit: 2020-05-14 | Discharge: 2020-05-16 | DRG: 803 | Disposition: A | Discharge status: Home / Self Care | Payer: Medicare Other | Source: Ambulatory Visit | Attending: Internal Medicine | Admitting: Internal Medicine

## 2020-05-14 ENCOUNTER — Inpatient Hospital Stay (HOSPITAL_BASED_OUTPATIENT_CLINIC_OR_DEPARTMENT_OTHER): Payer: Medicare Other | Admitting: Hematology and Oncology

## 2020-05-14 ENCOUNTER — Other Ambulatory Visit: Payer: Self-pay | Admitting: *Deleted

## 2020-05-14 ENCOUNTER — Inpatient Hospital Stay: Payer: Medicare Other | Attending: Physician Assistant

## 2020-05-14 ENCOUNTER — Other Ambulatory Visit: Payer: Self-pay

## 2020-05-14 VITALS — BP 146/61 | HR 80 | Temp 98.0°F | Resp 18 | Ht 61.0 in | Wt 93.9 lb

## 2020-05-14 DIAGNOSIS — N1330 Unspecified hydronephrosis: Secondary | ICD-10-CM | POA: Diagnosis present

## 2020-05-14 DIAGNOSIS — J9811 Atelectasis: Secondary | ICD-10-CM | POA: Diagnosis not present

## 2020-05-14 DIAGNOSIS — Z87891 Personal history of nicotine dependence: Secondary | ICD-10-CM

## 2020-05-14 DIAGNOSIS — Z809 Family history of malignant neoplasm, unspecified: Secondary | ICD-10-CM

## 2020-05-14 DIAGNOSIS — D591 Autoimmune hemolytic anemia, unspecified: Secondary | ICD-10-CM | POA: Diagnosis present

## 2020-05-14 DIAGNOSIS — Z791 Long term (current) use of non-steroidal anti-inflammatories (NSAID): Secondary | ICD-10-CM

## 2020-05-14 DIAGNOSIS — Z882 Allergy status to sulfonamides status: Secondary | ICD-10-CM

## 2020-05-14 DIAGNOSIS — Z888 Allergy status to other drugs, medicaments and biological substances status: Secondary | ICD-10-CM | POA: Diagnosis not present

## 2020-05-14 DIAGNOSIS — F0391 Unspecified dementia with behavioral disturbance: Secondary | ICD-10-CM | POA: Diagnosis present

## 2020-05-14 DIAGNOSIS — E039 Hypothyroidism, unspecified: Secondary | ICD-10-CM | POA: Diagnosis present

## 2020-05-14 DIAGNOSIS — C8307 Small cell B-cell lymphoma, spleen: Secondary | ICD-10-CM

## 2020-05-14 DIAGNOSIS — Z85828 Personal history of other malignant neoplasm of skin: Secondary | ICD-10-CM | POA: Diagnosis not present

## 2020-05-14 DIAGNOSIS — D696 Thrombocytopenia, unspecified: Secondary | ICD-10-CM | POA: Diagnosis present

## 2020-05-14 DIAGNOSIS — F03918 Unspecified dementia, unspecified severity, with other behavioral disturbance: Secondary | ICD-10-CM | POA: Diagnosis present

## 2020-05-14 DIAGNOSIS — F329 Major depressive disorder, single episode, unspecified: Secondary | ICD-10-CM | POA: Diagnosis present

## 2020-05-14 DIAGNOSIS — R161 Splenomegaly, not elsewhere classified: Secondary | ICD-10-CM | POA: Diagnosis not present

## 2020-05-14 DIAGNOSIS — J90 Pleural effusion, not elsewhere classified: Secondary | ICD-10-CM | POA: Diagnosis not present

## 2020-05-14 DIAGNOSIS — E785 Hyperlipidemia, unspecified: Secondary | ICD-10-CM | POA: Diagnosis present

## 2020-05-14 DIAGNOSIS — D709 Neutropenia, unspecified: Secondary | ICD-10-CM | POA: Diagnosis present

## 2020-05-14 DIAGNOSIS — D630 Anemia in neoplastic disease: Secondary | ICD-10-CM | POA: Diagnosis not present

## 2020-05-14 DIAGNOSIS — I129 Hypertensive chronic kidney disease with stage 1 through stage 4 chronic kidney disease, or unspecified chronic kidney disease: Secondary | ICD-10-CM | POA: Diagnosis present

## 2020-05-14 DIAGNOSIS — C8597 Non-Hodgkin lymphoma, unspecified, spleen: Secondary | ICD-10-CM

## 2020-05-14 DIAGNOSIS — Z79890 Hormone replacement therapy (postmenopausal): Secondary | ICD-10-CM

## 2020-05-14 DIAGNOSIS — D6959 Other secondary thrombocytopenia: Secondary | ICD-10-CM | POA: Diagnosis present

## 2020-05-14 DIAGNOSIS — Z91040 Latex allergy status: Secondary | ICD-10-CM | POA: Diagnosis not present

## 2020-05-14 DIAGNOSIS — Z79899 Other long term (current) drug therapy: Secondary | ICD-10-CM

## 2020-05-14 DIAGNOSIS — Z20822 Contact with and (suspected) exposure to covid-19: Secondary | ICD-10-CM | POA: Diagnosis present

## 2020-05-14 DIAGNOSIS — Z66 Do not resuscitate: Secondary | ICD-10-CM | POA: Diagnosis present

## 2020-05-14 DIAGNOSIS — M069 Rheumatoid arthritis, unspecified: Secondary | ICD-10-CM | POA: Diagnosis present

## 2020-05-14 DIAGNOSIS — F0390 Unspecified dementia without behavioral disturbance: Secondary | ICD-10-CM | POA: Diagnosis not present

## 2020-05-14 DIAGNOSIS — I10 Essential (primary) hypertension: Secondary | ICD-10-CM | POA: Diagnosis present

## 2020-05-14 DIAGNOSIS — D5911 Warm autoimmune hemolytic anemia: Secondary | ICD-10-CM

## 2020-05-14 DIAGNOSIS — D649 Anemia, unspecified: Secondary | ICD-10-CM | POA: Diagnosis not present

## 2020-05-14 DIAGNOSIS — N182 Chronic kidney disease, stage 2 (mild): Secondary | ICD-10-CM | POA: Diagnosis present

## 2020-05-14 DIAGNOSIS — Z8249 Family history of ischemic heart disease and other diseases of the circulatory system: Secondary | ICD-10-CM

## 2020-05-14 DIAGNOSIS — I700 Atherosclerosis of aorta: Secondary | ICD-10-CM | POA: Diagnosis not present

## 2020-05-14 DIAGNOSIS — C8300 Small cell B-cell lymphoma, unspecified site: Secondary | ICD-10-CM | POA: Diagnosis not present

## 2020-05-14 DIAGNOSIS — Z981 Arthrodesis status: Secondary | ICD-10-CM | POA: Diagnosis not present

## 2020-05-14 DIAGNOSIS — F32A Depression, unspecified: Secondary | ICD-10-CM | POA: Diagnosis present

## 2020-05-14 DIAGNOSIS — Z885 Allergy status to narcotic agent status: Secondary | ICD-10-CM

## 2020-05-14 DIAGNOSIS — Z880 Allergy status to penicillin: Secondary | ICD-10-CM

## 2020-05-14 DIAGNOSIS — C8590 Non-Hodgkin lymphoma, unspecified, unspecified site: Secondary | ICD-10-CM | POA: Diagnosis not present

## 2020-05-14 DIAGNOSIS — K219 Gastro-esophageal reflux disease without esophagitis: Secondary | ICD-10-CM | POA: Diagnosis present

## 2020-05-14 LAB — CMP (CANCER CENTER ONLY)
ALT: <6 U/L (ref 0–44)
AST: 28 U/L (ref 15–41)
Albumin: 3.8 g/dL (ref 3.5–5.0)
Alkaline Phosphatase: 81 U/L (ref 38–126)
Anion gap: 7 (ref 5–15)
BUN: 22 mg/dL (ref 8–23)
CO2: 24 mmol/L (ref 22–32)
Calcium: 8.9 mg/dL (ref 8.9–10.3)
Chloride: 108 mmol/L (ref 98–111)
Creatinine: 1.18 mg/dL — ABNORMAL HIGH (ref 0.44–1.00)
GFR, Est AFR Am: 51 mL/min — ABNORMAL LOW (ref >60)
GFR, Estimated: 44 mL/min — ABNORMAL LOW (ref >60)
Glucose, Bld: 88 mg/dL (ref 70–99)
Potassium: 4.9 mmol/L (ref 3.5–5.1)
Sodium: 139 mmol/L (ref 135–145)
Total Bilirubin: 1.1 mg/dL (ref 0.3–1.2)
Total Protein: 6 g/dL — ABNORMAL LOW (ref 6.5–8.1)

## 2020-05-14 LAB — CBC WITH DIFFERENTIAL (CANCER CENTER ONLY)
Abs Immature Granulocytes: 0.04 10*3/uL (ref 0.00–0.07)
Basophils Absolute: 0 10*3/uL (ref 0.0–0.1)
Basophils Relative: 0 %
Eosinophils Absolute: 0 10*3/uL (ref 0.0–0.5)
Eosinophils Relative: 1 %
HCT: 15.6 % — ABNORMAL LOW (ref 36.0–46.0)
Hemoglobin: 4.8 g/dL — CL (ref 12.0–15.0)
Immature Granulocytes: 2 %
Lymphocytes Relative: 14 %
Lymphs Abs: 0.3 10*3/uL — ABNORMAL LOW (ref 0.7–4.0)
MCH: 36.6 pg — ABNORMAL HIGH (ref 26.0–34.0)
MCHC: 30.8 g/dL (ref 30.0–36.0)
MCV: 119.1 fL — ABNORMAL HIGH (ref 80.0–100.0)
Monocytes Absolute: 0.3 10*3/uL (ref 0.1–1.0)
Monocytes Relative: 13 %
Neutro Abs: 1.5 10*3/uL — ABNORMAL LOW (ref 1.7–7.7)
Neutrophils Relative %: 70 %
Platelet Count: 94 10*3/uL — ABNORMAL LOW (ref 150–400)
RBC: 1.31 MIL/uL — ABNORMAL LOW (ref 3.87–5.11)
RDW: 14.4 % (ref 11.5–15.5)
WBC Count: 2.1 10*3/uL — ABNORMAL LOW (ref 4.0–10.5)
nRBC: 0 % (ref 0.0–0.2)

## 2020-05-14 LAB — PREPARE RBC (CROSSMATCH)

## 2020-05-14 LAB — DIRECT ANTIGLOBULIN TEST (NOT AT ARMC)
DAT, IgG: NEGATIVE
DAT, complement: NEGATIVE

## 2020-05-14 LAB — LACTATE DEHYDROGENASE: LDH: 988 U/L — ABNORMAL HIGH (ref 98–192)

## 2020-05-14 LAB — MAGNESIUM: Magnesium: 2.8 mg/dL — ABNORMAL HIGH (ref 1.7–2.4)

## 2020-05-14 LAB — PHOSPHORUS: Phosphorus: 3.6 mg/dL (ref 2.5–4.6)

## 2020-05-14 MED ORDER — SODIUM CHLORIDE 0.9% IV SOLUTION: Freq: Once | INTRAVENOUS | Status: AC

## 2020-05-14 MED ORDER — ACETAMINOPHEN 325 MG PO TABS
650.0000 mg | ORAL_TABLET | Freq: Once | ORAL | Status: AC
  Administered 2020-05-14: 650 mg via ORAL
  Filled 2020-05-14: qty 2

## 2020-05-14 MED ORDER — BISACODYL 10 MG RE SUPP: 10.0000 mg | RECTAL | Status: DC | PRN

## 2020-05-14 MED ORDER — ACETAMINOPHEN 325 MG PO TABS
650.0000 mg | ORAL_TABLET | Freq: Four times a day (QID) | ORAL | Status: DC | PRN
  Administered 2020-05-15: 650 mg via ORAL
  Filled 2020-05-14: qty 2

## 2020-05-14 MED ORDER — LEVOTHYROXINE SODIUM 100 MCG PO TABS
200.0000 ug | ORAL_TABLET | Freq: Every day | ORAL | Status: DC
  Administered 2020-05-15 – 2020-05-16 (×2): 200 ug via ORAL
  Filled 2020-05-14 (×2): qty 2

## 2020-05-14 MED ORDER — DOCUSATE SODIUM 100 MG PO CAPS
100.0000 mg | ORAL_CAPSULE | Freq: Two times a day (BID) | ORAL | Status: DC
  Administered 2020-05-14 – 2020-05-15 (×3): 100 mg via ORAL
  Filled 2020-05-14 (×4): qty 1

## 2020-05-14 MED ORDER — LACTATED RINGERS IV SOLN: INTRAVENOUS | Status: DC

## 2020-05-14 MED ORDER — THIAMINE HCL 100 MG PO TABS
100.0000 mg | ORAL_TABLET | Freq: Every day | ORAL | Status: DC
  Administered 2020-05-15: 100 mg via ORAL
  Filled 2020-05-14 (×2): qty 1

## 2020-05-14 MED ORDER — FOLIC ACID 1 MG PO TABS: 1.0000 mg | ORAL_TABLET | Freq: Every day | ORAL | Status: DC

## 2020-05-14 MED ORDER — HYDROCORTISONE (PERIANAL) 2.5 % EX CREA
1.0000 "application " | TOPICAL_CREAM | Freq: Two times a day (BID) | CUTANEOUS | Status: DC
  Filled 2020-05-14: qty 28.35

## 2020-05-14 MED ORDER — ACETAMINOPHEN 650 MG RE SUPP: 650.0000 mg | Freq: Four times a day (QID) | RECTAL | Status: DC | PRN

## 2020-05-14 MED ORDER — AMITRIPTYLINE HCL 25 MG PO TABS
50.0000 mg | ORAL_TABLET | Freq: Every day | ORAL | Status: DC
  Administered 2020-05-14 – 2020-05-15 (×2): 50 mg via ORAL
  Filled 2020-05-14 (×2): qty 2

## 2020-05-14 MED ORDER — ADULT MULTIVITAMIN W/MINERALS CH
1.0000 | ORAL_TABLET | Freq: Every day | ORAL | Status: DC
  Administered 2020-05-15: 1 via ORAL
  Filled 2020-05-14 (×2): qty 1

## 2020-05-14 MED ORDER — FOLIC ACID 1 MG PO TABS
1.0000 mg | ORAL_TABLET | Freq: Every day | ORAL | Status: DC
  Administered 2020-05-14 – 2020-05-15 (×2): 1 mg via ORAL
  Filled 2020-05-14 (×3): qty 1

## 2020-05-14 MED ORDER — DIPHENHYDRAMINE HCL 25 MG PO CAPS
25.0000 mg | ORAL_CAPSULE | Freq: Once | ORAL | Status: AC
  Administered 2020-05-14: 25 mg via ORAL
  Filled 2020-05-14: qty 1

## 2020-05-14 MED ORDER — PANTOPRAZOLE SODIUM 40 MG PO TBEC: 40.0000 mg | DELAYED_RELEASE_TABLET | Freq: Every day | ORAL | Status: DC

## 2020-05-14 MED ORDER — POLYETHYLENE GLYCOL 3350 17 G PO PACK: 17.0000 g | PACK | Freq: Every day | ORAL | Status: DC | PRN

## 2020-05-14 NOTE — Progress Notes (Signed)
Pt refusing care and refusing to change her  Clothes.Non complaining with care.
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
## 2020-05-14 NOTE — Progress Notes (Signed)
Pt to be admitted to [place] room # 1616 with sever anemia HGB is 4.8 . Asymptomatic at this time. Report called to Raquel Sarna, RN. Transported in wheelchair with daughter-in-law with her.

## 2020-05-15 ENCOUNTER — Inpatient Hospital Stay (HOSPITAL_COMMUNITY): Payer: Medicare Other

## 2020-05-15 DIAGNOSIS — D649 Anemia, unspecified: Secondary | ICD-10-CM | POA: Diagnosis not present

## 2020-05-15 LAB — CBC
HCT: 26.6 % — ABNORMAL LOW (ref 36.0–46.0)
Hemoglobin: 8.2 g/dL — ABNORMAL LOW (ref 12.0–15.0)
MCH: 32.2 pg (ref 26.0–34.0)
MCHC: 30.8 g/dL (ref 30.0–36.0)
MCV: 104.3 fL — ABNORMAL HIGH (ref 80.0–100.0)
Platelets: 111 10*3/uL — ABNORMAL LOW (ref 150–400)
RBC: 2.55 MIL/uL — ABNORMAL LOW (ref 3.87–5.11)
RDW: 23.9 % — ABNORMAL HIGH (ref 11.5–15.5)
WBC: 1.8 10*3/uL — ABNORMAL LOW (ref 4.0–10.5)
nRBC: 0 % (ref 0.0–0.2)

## 2020-05-15 LAB — VITAMIN B12: Vitamin B-12: 208 pg/mL (ref 180–914)

## 2020-05-15 LAB — HAPTOGLOBIN: Haptoglobin: <10 mg/dL — ABNORMAL LOW (ref 42–346)

## 2020-05-15 LAB — COMPREHENSIVE METABOLIC PANEL
ALT: 9 U/L (ref 0–44)
AST: 26 U/L (ref 15–41)
Albumin: 3.9 g/dL (ref 3.5–5.0)
Alkaline Phosphatase: 64 U/L (ref 38–126)
Anion gap: 8 (ref 5–15)
BUN: 21 mg/dL (ref 8–23)
CO2: 25 mmol/L (ref 22–32)
Calcium: 8.8 mg/dL — ABNORMAL LOW (ref 8.9–10.3)
Chloride: 104 mmol/L (ref 98–111)
Creatinine, Ser: 1.12 mg/dL — ABNORMAL HIGH (ref 0.44–1.00)
GFR calc Af Amer: 54 mL/min — ABNORMAL LOW (ref >60)
GFR calc non Af Amer: 47 mL/min — ABNORMAL LOW (ref >60)
Glucose, Bld: 95 mg/dL (ref 70–99)
Potassium: 4.6 mmol/L (ref 3.5–5.1)
Sodium: 137 mmol/L (ref 135–145)
Total Bilirubin: 1 mg/dL (ref 0.3–1.2)
Total Protein: 6.1 g/dL — ABNORMAL LOW (ref 6.5–8.1)

## 2020-05-15 LAB — FERRITIN: Ferritin: 1097 ng/mL — ABNORMAL HIGH (ref 11–307)

## 2020-05-15 LAB — RETICULOCYTES
Immature Retic Fract: 14.3 % (ref 2.3–15.9)
RBC.: 2.58 MIL/uL — ABNORMAL LOW (ref 3.87–5.11)
Retic Count, Absolute: 180.3 10*3/uL (ref 19.0–186.0)
Retic Ct Pct: 7 % — ABNORMAL HIGH (ref 0.4–3.1)

## 2020-05-15 LAB — PROTIME-INR
INR: 1 (ref 0.8–1.2)
Prothrombin Time: 12.7 seconds (ref 11.4–15.2)

## 2020-05-15 LAB — IRON AND TIBC
Iron: 54 ug/dL (ref 28–170)
Saturation Ratios: 22 % (ref 10.4–31.8)
TIBC: 240 ug/dL — ABNORMAL LOW (ref 250–450)
UIBC: 186 ug/dL

## 2020-05-15 LAB — SARS CORONAVIRUS 2 BY RT PCR (HOSPITAL ORDER, PERFORMED IN ~~LOC~~ HOSPITAL LAB): SARS Coronavirus 2: NEGATIVE

## 2020-05-15 LAB — SAVE SMEAR(SSMR), FOR PROVIDER SLIDE REVIEW

## 2020-05-15 LAB — FOLATE: Folate: 12.4 ng/mL (ref >5.9)

## 2020-05-15 MED ORDER — IOHEXOL 300 MG/ML  SOLN
100.0000 mL | Freq: Once | INTRAMUSCULAR | Status: AC | PRN
  Administered 2020-05-15: 100 mL via INTRAVENOUS

## 2020-05-15 MED ORDER — THIAMINE HCL 100 MG PO TABS: 100.0000 mg | ORAL_TABLET | Freq: Every day | ORAL | 0 refills | Status: AC

## 2020-05-15 MED ORDER — IOHEXOL 300 MG/ML  SOLN: 100.0000 mL | Freq: Once | INTRAMUSCULAR | Status: DC | PRN

## 2020-05-15 MED ORDER — IOHEXOL 9 MG/ML PO SOLN
ORAL | Status: AC
  Filled 2020-05-15: qty 1000

## 2020-05-15 MED ORDER — ADULT MULTIVITAMIN W/MINERALS CH: 1.0000 | ORAL_TABLET | Freq: Every day | ORAL | 0 refills | Status: AC

## 2020-05-15 MED ORDER — SODIUM CHLORIDE (PF) 0.9 % IJ SOLN
INTRAMUSCULAR | Status: AC
  Filled 2020-05-15: qty 50

## 2020-05-15 MED ORDER — IOHEXOL 9 MG/ML PO SOLN
500.0000 mL | ORAL | Status: AC
  Administered 2020-05-15: 500 mL via ORAL

## 2020-05-15 NOTE — Care Management Important Message (Signed)
Important Message  Patient Details IM Letter presented to the Patient Name: Joanne Whitehead MRN: [id number] Date of Birth: Jan 23, 1941   Medicare Important Message Given:  Yes     Kerin Salen 05/15/2020, 9:28 AM
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
## 2020-05-16 ENCOUNTER — Other Ambulatory Visit: Payer: Self-pay

## 2020-05-16 ENCOUNTER — Inpatient Hospital Stay (HOSPITAL_COMMUNITY): Payer: Medicare Other

## 2020-05-16 DIAGNOSIS — D649 Anemia, unspecified: Secondary | ICD-10-CM | POA: Diagnosis not present

## 2020-05-16 LAB — TYPE AND SCREEN
ABO/RH(D): O POS
Antibody Screen: POSITIVE
Donor AG Type: NEGATIVE
Donor AG Type: NEGATIVE
Unit division: 0
Unit division: 0

## 2020-05-16 LAB — BPAM RBC
Blood Product Expiration Date: 202108292359
Blood Product Expiration Date: 202108292359
ISSUE DATE / TIME: 202107282331
ISSUE DATE / TIME: 202107290301
Unit Type and Rh: 5100
Unit Type and Rh: 5100

## 2020-05-16 LAB — CBC WITH DIFFERENTIAL/PLATELET
Abs Immature Granulocytes: 0.03 10*3/uL (ref 0.00–0.07)
Basophils Absolute: 0 10*3/uL (ref 0.0–0.1)
Basophils Relative: 0 %
Eosinophils Absolute: 0 10*3/uL (ref 0.0–0.5)
Eosinophils Relative: 1 %
HCT: 25.9 % — ABNORMAL LOW (ref 36.0–46.0)
Hemoglobin: 8.1 g/dL — ABNORMAL LOW (ref 12.0–15.0)
Immature Granulocytes: 1 %
Lymphocytes Relative: 10 %
Lymphs Abs: 0.2 10*3/uL — ABNORMAL LOW (ref 0.7–4.0)
MCH: 32.3 pg (ref 26.0–34.0)
MCHC: 31.3 g/dL (ref 30.0–36.0)
MCV: 103.2 fL — ABNORMAL HIGH (ref 80.0–100.0)
Monocytes Absolute: 0.3 10*3/uL (ref 0.1–1.0)
Monocytes Relative: 10 %
Neutro Abs: 1.9 10*3/uL (ref 1.7–7.7)
Neutrophils Relative %: 78 %
Platelets: 102 10*3/uL — ABNORMAL LOW (ref 150–400)
RBC: 2.51 MIL/uL — ABNORMAL LOW (ref 3.87–5.11)
RDW: 22.7 % — ABNORMAL HIGH (ref 11.5–15.5)
WBC: 2.4 10*3/uL — ABNORMAL LOW (ref 4.0–10.5)
nRBC: 0 % (ref 0.0–0.2)

## 2020-05-16 LAB — HAPTOGLOBIN: Haptoglobin: <10 mg/dL — ABNORMAL LOW (ref 42–346)

## 2020-05-16 MED ORDER — FLUMAZENIL 0.5 MG/5ML IV SOLN
INTRAVENOUS | Status: AC
  Filled 2020-05-16: qty 5

## 2020-05-16 MED ORDER — NALOXONE HCL 0.4 MG/ML IJ SOLN
INTRAMUSCULAR | Status: AC
  Filled 2020-05-16: qty 1

## 2020-05-16 MED ORDER — SODIUM CHLORIDE 0.9 % IV SOLN
INTRAVENOUS | Status: AC
  Filled 2020-05-16: qty 250

## 2020-05-16 MED ORDER — PREDNISONE 10 MG PO TABS
40.0000 mg | ORAL_TABLET | Freq: Every day | ORAL | 0 refills | Status: DC
Start: 2020-05-16 — End: 2020-07-03

## 2020-05-16 MED ORDER — MIDAZOLAM HCL 2 MG/2ML IJ SOLN
INTRAMUSCULAR | Status: AC | PRN
  Administered 2020-05-16 (×3): 0.5 mg via INTRAVENOUS

## 2020-05-16 MED ORDER — FENTANYL CITRATE (PF) 100 MCG/2ML IJ SOLN
INTRAMUSCULAR | Status: AC
  Filled 2020-05-16: qty 2

## 2020-05-16 MED ORDER — LIDOCAINE HCL (PF) 1 % IJ SOLN
INTRAMUSCULAR | Status: AC | PRN
  Administered 2020-05-16: 10 mL via INTRADERMAL

## 2020-05-16 MED ORDER — FENTANYL CITRATE (PF) 100 MCG/2ML IJ SOLN
INTRAMUSCULAR | Status: AC | PRN
  Administered 2020-05-16 (×3): 25 ug via INTRAVENOUS

## 2020-05-16 MED ORDER — MIDAZOLAM HCL 2 MG/2ML IJ SOLN
INTRAMUSCULAR | Status: AC
  Filled 2020-05-16: qty 2

## 2020-05-16 NOTE — Procedures (Signed)
Interventional Radiology Procedure Note  Procedure: CT BM ASP AND CORE BX    Complications: None  Estimated Blood Loss:  MIN  Findings: 11 G CORE AND ASP    M. TREVOR Mariaha Ellington, MD    

## 2020-05-16 NOTE — Discharge Summary (Signed)
Physician Discharge Summary  Joanne Whitehead ZOX:[id number] DOB: June 04, 1941 DOA: 05/14/2020  PCP: Lonie Peak, PA-C  Admit date: 05/14/2020 Discharge date: 05/16/2020  Recommendations for Outpatient Follow-up:  1. Discharge to home under family supervision 2. Follow up with Dr. Leonides Schanz as directed. 3. Follow up with PCP in 7-10 days. 4. Have CBC drawn at next visit with Dr. Leonides Schanz and PCP.  Discharge Diagnoses: Principal diagnosis is #1 1. Severe anemia 2. Splenic marginal zone lymphoma 3. Left hydronephrosis and hydroureter. 4. Dementia 5. Hyperlipidemia 6. Hypothyroidism  Discharge Condition: Fair  Disposition: Home under the care of her family  Diet recommendation: Regular  Filed Weights   05/15/20 1203  Weight: (!) 42.5 kg    History of present illness:   The patient arrives at [facility] as a Direct Admit from the office of Dr. Leonides Schanz at the [facility] due to the finding of a hemoglobin of 4.8. [name] is a 79 yr old woman who carries a past medical history significant for Marginal Zone Splenic Lymphoma. She has had continual issues with severe anemia mostly with hemoglobins varying between 8.2 - 8.4 and 6.0 - 6.8. Today her hemoglobin was 4.8. The patient lives on her own, but is visited daily be her two sons. Today the patient is accompanied by her daughter-in-law. The patient herself is not helpful with history and/or interview as she is currently perseverating on being hungry and wanting to leave. She has a very child-like affect.  The daughter-in-law is not usually directly involved in her care. She states that the patient's current behavior/mental status is consistent with her baseline. However, she denies knowledge of any diagnosis of dementia.  She also is not familiar with the patient's medical history. I have discussed the patient with Dr. Leonides Schanz. He states that the patient has Splenic marginal zone B-cell lymphoma. He recommends that the patient be transfused with 2  units of PRBC's. He has also recommended that she have a follow up CT of the chest abdomen and pelvis to follow her lymphoma. He also would like to check haptoglobin and DAT.   Vitals upon arrival are within normal limits. Lab results from her visit with Dr. Leonides Schanz demonstrate CKD IIa with a creatinine that appears to be at baseline of 1.18. WBC is 2.1, Hgb is 4.8, Platelets are also low at 94. LDH is elevated at 988. Most recent previous LDH was 1094 on 03/31/2020.  Examination of other medical records available in Epic reveal a history of RA, AAA, Depression, Hyperlipidemia, Hypertension, Hypothyroidism, and thoracic compression fracture s/p fusion T12 - L1.  Review of systems is not obtainable due to the patient's oppositional affect and perseveration on the arrival of her tray and the daughter-in-law's lack of familiarity with the patient's medical history.  Hospital Course:  The patient arrives at [facility] as a Direct Admit from the office of Dr. Leonides Schanz at the [facility] due to the finding of a hemoglobin of 4.8. [name] is a 79 yr old woman who carries a past medical history significant for Marginal Zone Splenic Lymphoma. She has had continual issues with severe anemia mostly with hemoglobins varying between 8.2 - 8.4 and 6.0 - 6.8. Today her hemoglobin was 4.8. The patient lives on her own, but is visited daily be her two sons. Today the patient is accompanied by her daughter-in-law. The patient herself is not helpful with history and/or interview as she is currently perseverating on being hungry and wanting to leave. She has a very child-like affect.  The daughter-in-law is not usually directly involved in her care. She states that the patient's current behavior/mental status is consistent with her baseline. However, she denies knowledge of any diagnosis of dementia. She also is not familiar with the patient's medical history. I have discussed the patient with Dr. Leonides Schanz. He states that the  patient has Splenic marginal zone B-cell lymphoma. He recommends that the patient be transfused with 2 units of PRBC's. He has also recommended that she have a follow up CT of the chest abdomen and pelvis to follow her lymphoma. DAT and haptoglobin were tested. DAT was negative, and haptoglobin is pending.   Vitals upon arrival are within normal limits. Lab results from her visit with Dr. Leonides Schanz demonstrate CKD IIa with a creatinine that appears to be at baseline of 1.18. WBC is 2.1, Hgb is 4.8, Platelets are also low at 94. LDH is elevated at 988. Most recent previous LDH was 1094 on 03/31/2020.  Examination of other medical records available in Epic reveal a history of RA, AAA, Depression, Hyperlipidemia, Hypertension, Hypothyroidism, and thoracic compression fracture s/p fusion T12 - L1.  [facility] were consulted to admit the patient for transfusion and work up. Oncology and Interventional radiology have been consulted. She has received 2 units of PRBC's in transfusion.  I have discussed the patient with her oncologist Dr. Leonides Schanz. She will be kept overnight to undergo bone marrow biopsy with IR. IR has been consulted. CTa chest abdomen and pelvis was performed. It was unremarkable except fo the appearance of left sided hydronephrosis and proximal hydroureter that had not been seen on previous CT's was observed. I have discussed this abnormality with Dr. Mena Goes from urology. He states that the patient may be discharged to home and follow up with him as outpatient as she demonstrates no renal insufficiency or flank pain.  Today's assessment: S: The patient is resting quietly. Wants to go home. O: Vitals:  Vitals:   05/16/20 1026 05/16/20 1513  BP: 128/71 (!) 164/73  Pulse: 70 76  Resp: 15 22  Temp: 98 F (36.7 C) 98.3 F (36.8 C)  SpO2: 95% 97%   General appearance:alert, appears stated age, distracted, mild distress and uncooperative Head:Normocephalic, without obvious  abnormality, atraumatic Eyes:conjunctivae/corneas clear. PERRL, EOM's intact. Fundi benign. Throat:lips, mucosa, and tongue normal; teeth and gums normal Neck:no adenopathy, no carotid bruit, no JVD, supple, symmetrical, trachea midline and thyroid not enlarged, symmetric, no tenderness/mass/nodules Resp:No increased work of breathing. No wheezes, rales, or rhonchi. No tactile fremitus. Chest wall:no tenderness Cardio:regular rate and rhythm, S1, S2 normal, no murmur, click, rub or gallop WU:JWJX, non-tender; bowel sounds normal; no masses, no organomegaly Extremities:extremities normal, atraumatic, no cyanosis or edema Pulses:2+ and symmetric Skin:Skin color, texture, turgor normal. No rashes or lesions Lymph nodes:Cervical, supraclavicular, and axillary nodes normal. Neurologic:Grossly normal  Discharge Instructions  Discharge Instructions    Activity as tolerated - No restrictions   Complete by: As directed    Activity as tolerated - No restrictions   Complete by: As directed    Call MD for:  extreme fatigue   Complete by: As directed    Call MD for:  persistant dizziness or light-headedness   Complete by: As directed    Call MD for:  persistant nausea and vomiting   Complete by: As directed    Call MD for:  severe uncontrolled pain   Complete by: As directed    Call MD for:  temperature >100.4   Complete by: As directed  Call MD for:  temperature >100.4   Complete by: As directed    Diet - low sodium heart healthy   Complete by: As directed    Diet general   Complete by: As directed    Discharge instructions   Complete by: As directed    Discharge to home to the care of her family. Follow up with oncology as directed.  Follow up with PCP in 7-10 days. Have CBC checked at that visit. Follow up with Dr. Jerilee Field as outpatient within 2 weeks.   Discharge instructions   Complete by: As directed    Discharge to home under family supervision Follow  up with Dr. Leonides Schanz as directed. Follow up with PCP in 7-10 days. Have CBC drawn at next visit with Dr. Leonides Schanz and PCP.   Increase activity slowly   Complete by: As directed    Increase activity slowly   Complete by: As directed      Allergies as of 05/16/2020      Reactions   Penicillins Itching, Other (See Comments)   PATIENT HAS HAD A PCN REACTION WITH IMMEDIATE RASH, FACIAL/TONGUE/THROAT SWELLING, SOB, OR LIGHTHEADEDNESS WITH HYPOTENSION:  #  #  #  YES  #  #  #   HAS PT DEVELOPED SEVERE RASH INVOLVING MUCUS MEMBRANES or SKIN NECROSIS: #  #  #  YES  #  #  #  Has patient had a PCN reaction that required hospitalization: Unknown Has patient had a PCN reaction occurring within the last 10 years: No If all of the above answers are "NO", then may proceed with Cephalosporin use.   Amoxicillin Itching, Rash   Gemfibrozil Nausea And Vomiting   Latex Other (See Comments)   Ear infection   Sulfa Antibiotics Itching, Rash, Other (See Comments)   Upset stomach   Tramadol Itching      Medication List    TAKE these medications   acetaminophen 500 MG tablet Commonly known as: TYLENOL Take 500 mg by mouth every 6 (six) hours as needed for moderate pain.   amitriptyline 25 MG tablet Commonly known as: ELAVIL Take 50 mg by mouth at bedtime.   amLODipine 2.5 MG tablet Commonly known as: NORVASC Take 2.5 mg by mouth daily.   bisacodyl 10 MG suppository Commonly known as: DULCOLAX Place 10 mg rectally as needed for moderate constipation.   docusate sodium 100 MG capsule Commonly known as: COLACE Take 100 mg by mouth 2 (two) times daily.   folic acid 1 MG tablet Commonly known as: FOLVITE Take 1 mg by mouth daily.   hydrocortisone 2.5 % rectal cream Commonly known as: ANUSOL-HC Place 1 application rectally in the morning and at bedtime.   leflunomide 20 MG tablet Commonly known as: ARAVA Take 20 mg by mouth daily.   levothyroxine 200 MCG tablet Commonly known as:  SYNTHROID Take 200 mcg by mouth daily before breakfast.   losartan 25 MG tablet Commonly known as: COZAAR Take 25 mg by mouth daily.   meloxicam 15 MG tablet Commonly known as: MOBIC Take 15 mg by mouth daily as needed for pain.   methotrexate 2.5 MG tablet Take 15 mg by mouth once a week. Caution:Chemotherapy. Protect from light.   multivitamin with minerals Tabs tablet Take 1 tablet by mouth daily.   polyethylene glycol 17 g packet Commonly known as: MIRALAX / GLYCOLAX Take 17 g by mouth daily as needed for moderate constipation.   predniSONE 10 MG tablet Commonly known as: DELTASONE Take  4 tablets (40 mg total) by mouth daily.   thiamine 100 MG tablet Take 1 tablet (100 mg total) by mouth daily.      Allergies  Allergen Reactions  . Penicillins Itching and Other (See Comments)    PATIENT HAS HAD A PCN REACTION WITH IMMEDIATE RASH, FACIAL/TONGUE/THROAT SWELLING, SOB, OR LIGHTHEADEDNESS WITH HYPOTENSION:  #  #  #  YES  #  #  #   HAS PT DEVELOPED SEVERE RASH INVOLVING MUCUS MEMBRANES or SKIN NECROSIS: #  #  #  YES  #  #  #  Has patient had a PCN reaction that required hospitalization: Unknown Has patient had a PCN reaction occurring within the last 10 years: No If all of the above answers are "NO", then may proceed with Cephalosporin use.   Marland Kitchen Amoxicillin Itching and Rash  . Gemfibrozil Nausea And Vomiting  . Latex Other (See Comments)    Ear infection  . Sulfa Antibiotics Itching, Rash and Other (See Comments)    Upset stomach   . Tramadol Itching    The results of significant diagnostics from this hospitalization (including imaging, microbiology, ancillary and laboratory) are listed below for reference.    Significant Diagnostic Studies: CT CHEST ABDOMEN PELVIS W CONTRAST  Result Date: 05/15/2020 CLINICAL DATA:  Evaluate splenomegaly. EXAM: CT CHEST, ABDOMEN, AND PELVIS WITH CONTRAST TECHNIQUE: Multidetector CT imaging of the chest, abdomen and pelvis was  performed following the standard protocol during bolus administration of intravenous contrast. CONTRAST:  OMNIPAQUE IOHEXOL 300 MG/ML  SOLN COMPARISON:  December 20, 2019 FINDINGS: CT CHEST FINDINGS Cardiovascular: There is mild to moderate severity calcification of the aortic arch. Normal heart size. No pericardial effusion. Moderate to marked severity coronary artery calcification is noted. Mediastinum/Nodes: No enlarged mediastinal, hilar, or axillary lymph nodes. The thyroid gland and trachea demonstrate no significant findings. There is a large, stable hiatal hernia. Lungs/Pleura: Very mild atelectasis is seen within the posterior aspect of the bilateral lower lobes. Small bilateral pleural effusions are seen. No pneumothorax is identified. Musculoskeletal: There is evidence of prior vertebroplasty involving the T10 and T11 vertebral bodies. A chronic compression fracture deformity of the T12 vertebral body is also seen. A chronic fracture deformity is noted involving the manubrium of the sternum. CT ABDOMEN PELVIS FINDINGS Hepatobiliary: No focal liver abnormality is seen. No gallstones, gallbladder wall thickening, or biliary dilatation. Pancreas: Unremarkable. No pancreatic ductal dilatation or surrounding inflammatory changes. Spleen: The spleen is markedly enlarged. A stable 6 mm focus of parenchymal low attenuation is seen within the superior aspect of the spleen. Multiple dilated and tortuous vessels are seen along the splenic hilum. Adrenals/Urinary Tract: Adrenal glands are unremarkable. The right kidney is atrophic in appearance. The left kidney is normal in size, without focal lesions. Mild to moderate severity left-sided hydronephrosis and proximal hydroureter is seen. This represents a new finding when compared to the prior study. Bladder is unremarkable. Stomach/Bowel: There is a large, stable hiatal hernia. The appendix is not clearly identified. No evidence of bowel wall thickening,  distention, or inflammatory changes. Vascular/Lymphatic: There is extensive stenting of the infrarenal abdominal aorta and bilateral common iliac arteries without evidence of stent occlusion or stent leak. No enlarged abdominal or pelvic lymph nodes are seen. Reproductive: The uterus is retroflexed and otherwise normal in appearance. Multiple dilated surrounding vessels are seen. The bilateral adnexa are unremarkable. Other: No abdominal wall hernia or abnormality. A trace amount of posterior pelvic fluid is noted. Musculoskeletal: There is evidence of  prior vertebroplasty involving the L1 vertebral body. IMPRESSION: 1. Stable marked severity splenomegaly with multiple dilated and tortuous vessels along the splenic hilum. 2. Small bilateral pleural effusions with mild posterior bilateral lower lobe atelectasis. 3. Large, stable hiatal hernia. 4. Mild to moderate severity left-sided hydronephrosis and proximal hydroureter. This represents a new finding when compared to the prior study. 5. Atrophic right kidney. 6. Extensive stenting of the infrarenal abdominal aorta and bilateral common iliac arteries without evidence of stent occlusion or stent leak. 7. Chronic fracture deformity of the manubrium of the sternum. 8. Trace amount of posterior pelvic fluid. 9. Aortic atherosclerosis. Aortic Atherosclerosis (ICD10-I70.0). Electronically Signed   By: Aram Candela M.D.   On: 05/15/2020 14:00   CT BIOPSY  Result Date: 05/16/2020 INDICATION: Severe anemia, splenic marginal zone EXAM: CT GUIDED RIGHT ILIAC BONE MARROW ASPIRATION AND CORE BIOPSY Date:  05/16/2020 05/16/2020 10:19 am Radiologist:  M. Ruel Favors, MD Guidance:  CT FLUOROSCOPY TIME:  Fluoroscopy Time: NONE. MEDICATIONS: 1% lidocaine ANESTHESIA/SEDATION: 1.5 mg IV Versed; 70 mcg IV Fentanyl Moderate Sedation Time:  10 minutes The patient was continuously monitored during the procedure by the interventional radiology nurse under my direct supervision.  CONTRAST:  None. COMPLICATIONS: None PROCEDURE: Informed consent was obtained from the patient following explanation of the procedure, risks, benefits and alternatives. The patient understands, agrees and consents for the procedure. All questions were addressed. A time out was performed. The patient was positioned prone and non-contrast localization CT was performed of the pelvis to demonstrate the iliac marrow spaces. Maximal barrier sterile technique utilized including caps, mask, sterile gowns, sterile gloves, large sterile drape, hand hygiene, and Betadine prep. Under sterile conditions and local anesthesia, an 11 gauge coaxial bone biopsy needle was advanced into the right iliac marrow space. Needle position was confirmed with CT imaging. Initially, bone marrow aspiration was performed. Next, the 11 gauge outer cannula was utilized to obtain a right iliac bone marrow core biopsy. Needle was removed. Hemostasis was obtained with compression. The patient tolerated the procedure well. Samples were prepared with the cytotechnologist. No immediate complications. IMPRESSION: CT guided right iliac bone marrow aspiration and core biopsy. Electronically Signed   By: Judie Petit.  Shick M.D.   On: 05/16/2020 11:55   CT BONE MARROW BIOPSY & ASPIRATION  Result Date: 05/16/2020 INDICATION: Severe anemia, splenic marginal zone EXAM: CT GUIDED RIGHT ILIAC BONE MARROW ASPIRATION AND CORE BIOPSY Date:  05/16/2020 05/16/2020 10:19 am Radiologist:  M. Ruel Favors, MD Guidance:  CT FLUOROSCOPY TIME:  Fluoroscopy Time: NONE. MEDICATIONS: 1% lidocaine ANESTHESIA/SEDATION: 1.5 mg IV Versed; 70 mcg IV Fentanyl Moderate Sedation Time:  10 minutes The patient was continuously monitored during the procedure by the interventional radiology nurse under my direct supervision. CONTRAST:  None. COMPLICATIONS: None PROCEDURE: Informed consent was obtained from the patient following explanation of the procedure, risks, benefits and alternatives. The  patient understands, agrees and consents for the procedure. All questions were addressed. A time out was performed. The patient was positioned prone and non-contrast localization CT was performed of the pelvis to demonstrate the iliac marrow spaces. Maximal barrier sterile technique utilized including caps, mask, sterile gowns, sterile gloves, large sterile drape, hand hygiene, and Betadine prep. Under sterile conditions and local anesthesia, an 11 gauge coaxial bone biopsy needle was advanced into the right iliac marrow space. Needle position was confirmed with CT imaging. Initially, bone marrow aspiration was performed. Next, the 11 gauge outer cannula was utilized to obtain a right iliac bone marrow core  biopsy. Needle was removed. Hemostasis was obtained with compression. The patient tolerated the procedure well. Samples were prepared with the cytotechnologist. No immediate complications. IMPRESSION: CT guided right iliac bone marrow aspiration and core biopsy. Electronically Signed   By: Judie Petit.  Shick M.D.   On: 05/16/2020 11:55    Microbiology: Recent Results (from the past 240 hour(s))  SARS Coronavirus 2 by RT PCR (hospital order, performed in [facility] hospital lab) Nasopharyngeal Nasopharyngeal Swab     Status: None   Collection Time: 05/15/20  2:30 AM   Specimen: Nasopharyngeal Swab  Result Value Ref Range Status   SARS Coronavirus 2 NEGATIVE NEGATIVE Final    Comment: (NOTE) SARS-CoV-2 target nucleic acids are NOT DETECTED.  The SARS-CoV-2 RNA is generally detectable in upper and lower respiratory specimens during the acute phase of infection. The lowest concentration of SARS-CoV-2 viral copies this assay can detect is 250 copies / mL. A negative result does not preclude SARS-CoV-2 infection and should not be used as the sole basis for treatment or other patient management decisions.  A negative result may occur with improper specimen collection / handling, submission of specimen  other than nasopharyngeal swab, presence of viral mutation(s) within the areas targeted by this assay, and inadequate number of viral copies (<250 copies / mL). A negative result must be combined with clinical observations, patient history, and epidemiological information.  Fact Sheet for Patients:   BoilerBrush.com.cy  Fact Sheet for Healthcare Providers: https://pope.com/  This test is not yet approved or  cleared by the [place] FDA and has been authorized for detection and/or diagnosis of SARS-CoV-2 by FDA under an Emergency Use Authorization (EUA).  This EUA will remain in effect (meaning this test can be used) for the duration of the COVID-19 declaration under Section 564(b)(1) of the Act, 21 U.S.C. section 360bbb-3(b)(1), unless the authorization is terminated or revoked sooner.  Performed at [facility], [address] [place], [place] [address]      Labs: Basic Metabolic Panel: Recent Labs  Lab 05/14/20 1452 05/14/20 1916 05/15/20 1055  NA 139  --  137  K 4.9  --  4.6  CL 108  --  104  CO2 24  --  25  GLUCOSE 88  --  95  BUN 22  --  21  CREATININE 1.18*  --  1.12*  CALCIUM 8.9  --  8.8*  MG  --  2.8*  --   PHOS  --  3.6  --    Liver Function Tests: Recent Labs  Lab 05/14/20 1452 05/15/20 1055  AST 28 26  ALT <6 9  ALKPHOS 81 64  BILITOT 1.1 1.0  PROT 6.0* 6.1*  ALBUMIN 3.8 3.9   No results for input(s): LIPASE, AMYLASE in the last 168 hours. No results for input(s): AMMONIA in the last 168 hours. CBC: Recent Labs  Lab 05/14/20 1452 05/15/20 1055 05/16/20 0604  WBC 2.1* 1.8* 2.4*  NEUTROABS 1.5*  --  1.9  HGB 4.8* 8.2* 8.1*  HCT 15.6* 26.6* 25.9*  MCV 119.1* 104.3* 103.2*  PLT 94* 111* 102*   Cardiac Enzymes: No results for input(s): CKTOTAL, CKMB, CKMBINDEX, TROPONINI in the last 168 hours. BNP: BNP (last 3 results) No results for input(s): BNP in the last 8760  hours.  ProBNP (last 3 results) No results for input(s): PROBNP in the last 8760 hours.  CBG: No results for input(s): GLUCAP in the last 168 hours.  Principal Problem:   Severe anemia  Active Problems:   Benign essential hypertension   Depression   Hyperlipidemia   Hypothyroidism   Rheumatoid arthritis (HCC)   Gastroesophageal reflux disease   Dementia with behavioral disturbance (HCC)   Splenic marginal zone b-cell lymphoma (HCC)   Thrombocytopenia (HCC)   Time coordinating discharge: 38 minutes.  Signed:        Nguyen Butler, DO [facility]  05/16/2020, 7:20 PM

## 2020-05-16 NOTE — Progress Notes (Signed)
Patient discharged home with son in stable condition. Discharge instructions given. Scripts sent to pharmacy of choice. No immediate questions or concerns at this time. Discharged from unit via wheelchair.

## 2020-05-18 ENCOUNTER — Encounter: Payer: Self-pay | Admitting: Hematology and Oncology
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
## 2020-05-20 LAB — SURGICAL PATHOLOGY

## 2020-05-22 ENCOUNTER — Other Ambulatory Visit: Payer: Self-pay | Admitting: *Deleted

## 2020-05-22 DIAGNOSIS — C8307 Small cell B-cell lymphoma, spleen: Secondary | ICD-10-CM

## 2020-05-23 ENCOUNTER — Other Ambulatory Visit: Payer: Medicare Other

## 2020-05-26 ENCOUNTER — Encounter (HOSPITAL_COMMUNITY): Payer: Self-pay | Admitting: Hematology and Oncology

## 2020-05-30 ENCOUNTER — Inpatient Hospital Stay: Payer: Medicaid Other | Attending: Physician Assistant | Admitting: Hematology and Oncology

## 2020-05-30 ENCOUNTER — Other Ambulatory Visit: Payer: Self-pay | Admitting: Hematology and Oncology

## 2020-05-30 ENCOUNTER — Inpatient Hospital Stay: Payer: Medicaid Other

## 2020-05-30 DIAGNOSIS — C8307 Small cell B-cell lymphoma, spleen: Secondary | ICD-10-CM

## 2020-05-30 NOTE — Progress Notes (Signed)
Opened in error  This encounter was created in error - please disregard. 

## 2020-05-30 NOTE — Progress Notes (Signed)
Orders only

## 2020-06-26 ENCOUNTER — Telehealth: Payer: Self-pay | Admitting: *Deleted

## 2020-06-26 ENCOUNTER — Telehealth: Payer: Self-pay | Admitting: Hematology and Oncology

## 2020-06-26 NOTE — Telephone Encounter (Signed)
Call made to pt's son, Lennette Bihari in order get patient  re-scheduled to see Dr. Lorenso Courier and to check on her status.  No answer and no vm avaialble. Scheduling message sent

## 2020-06-26 NOTE — Telephone Encounter (Signed)
Opened in error

## 2020-06-26 NOTE — Telephone Encounter (Signed)
Scheduled appt per 9/9 sch msg - pt son is aware of appt.

## 2020-07-03 ENCOUNTER — Inpatient Hospital Stay: Payer: Medicare Other | Attending: Physician Assistant | Admitting: Hematology and Oncology

## 2020-07-03 ENCOUNTER — Encounter: Payer: Self-pay | Admitting: Hematology and Oncology

## 2020-07-03 ENCOUNTER — Inpatient Hospital Stay: Payer: Medicare Other

## 2020-07-03 ENCOUNTER — Other Ambulatory Visit: Payer: Self-pay

## 2020-07-03 VITALS — BP 154/85 | HR 76 | Temp 98.1°F | Resp 18 | Ht 61.0 in | Wt 97.5 lb

## 2020-07-03 DIAGNOSIS — C8307 Small cell B-cell lymphoma, spleen: Secondary | ICD-10-CM

## 2020-07-03 DIAGNOSIS — R161 Splenomegaly, not elsewhere classified: Secondary | ICD-10-CM | POA: Insufficient documentation

## 2020-07-03 DIAGNOSIS — Z9221 Personal history of antineoplastic chemotherapy: Secondary | ICD-10-CM | POA: Diagnosis not present

## 2020-07-03 DIAGNOSIS — D589 Hereditary hemolytic anemia, unspecified: Secondary | ICD-10-CM | POA: Insufficient documentation

## 2020-07-03 DIAGNOSIS — D591 Autoimmune hemolytic anemia, unspecified: Secondary | ICD-10-CM

## 2020-07-03 DIAGNOSIS — D5911 Warm autoimmune hemolytic anemia: Secondary | ICD-10-CM | POA: Diagnosis not present

## 2020-07-03 DIAGNOSIS — C8300 Small cell B-cell lymphoma, unspecified site: Secondary | ICD-10-CM | POA: Diagnosis not present

## 2020-07-03 LAB — CBC WITH DIFFERENTIAL (CANCER CENTER ONLY)
Abs Immature Granulocytes: 0 10*3/uL (ref 0.00–0.07)
Basophils Absolute: 0 10*3/uL (ref 0.0–0.1)
Basophils Relative: 0 %
Eosinophils Absolute: 0.1 10*3/uL (ref 0.0–0.5)
Eosinophils Relative: 2 %
HCT: 27.5 % — ABNORMAL LOW (ref 36.0–46.0)
Hemoglobin: 8.9 g/dL — ABNORMAL LOW (ref 12.0–15.0)
Immature Granulocytes: 0 %
Lymphocytes Relative: 14 %
Lymphs Abs: 0.5 10*3/uL — ABNORMAL LOW (ref 0.7–4.0)
MCH: 33.3 pg (ref 26.0–34.0)
MCHC: 32.4 g/dL (ref 30.0–36.0)
MCV: 103 fL — ABNORMAL HIGH (ref 80.0–100.0)
Monocytes Absolute: 0.4 10*3/uL (ref 0.1–1.0)
Monocytes Relative: 11 %
Neutro Abs: 2.9 10*3/uL (ref 1.7–7.7)
Neutrophils Relative %: 73 %
Platelet Count: 161 10*3/uL (ref 150–400)
RBC: 2.67 MIL/uL — ABNORMAL LOW (ref 3.87–5.11)
RDW: 13.4 % (ref 11.5–15.5)
WBC Count: 3.9 10*3/uL — ABNORMAL LOW (ref 4.0–10.5)
nRBC: 0 % (ref 0.0–0.2)

## 2020-07-03 LAB — CMP (CANCER CENTER ONLY)
ALT: 11 U/L (ref 0–44)
AST: 21 U/L (ref 15–41)
Albumin: 4.1 g/dL (ref 3.5–5.0)
Alkaline Phosphatase: 123 U/L (ref 38–126)
Anion gap: 6 (ref 5–15)
BUN: 24 mg/dL — ABNORMAL HIGH (ref 8–23)
CO2: 27 mmol/L (ref 22–32)
Calcium: 9.3 mg/dL (ref 8.9–10.3)
Chloride: 107 mmol/L (ref 98–111)
Creatinine: 1.14 mg/dL — ABNORMAL HIGH (ref 0.44–1.00)
GFR, Est AFR Am: 53 mL/min — ABNORMAL LOW (ref >60)
GFR, Estimated: 46 mL/min — ABNORMAL LOW (ref >60)
Glucose, Bld: 97 mg/dL (ref 70–99)
Potassium: 4.8 mmol/L (ref 3.5–5.1)
Sodium: 140 mmol/L (ref 135–145)
Total Bilirubin: 0.5 mg/dL (ref 0.3–1.2)
Total Protein: 6.7 g/dL (ref 6.5–8.1)

## 2020-07-03 LAB — SAVE SMEAR(SSMR), FOR PROVIDER SLIDE REVIEW

## 2020-07-03 LAB — LACTATE DEHYDROGENASE: LDH: 303 U/L — ABNORMAL HIGH (ref 98–192)

## 2020-07-03 MED ORDER — PREDNISONE 5 MG PO TABS
5.0000 mg | ORAL_TABLET | ORAL | 0 refills | Status: AC
Start: 2020-07-03 — End: ?
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
## 2020-07-04 ENCOUNTER — Telehealth: Payer: Self-pay | Admitting: Hematology and Oncology

## 2020-07-04 LAB — SAMPLE TO BLOOD BANK

## 2020-07-04 LAB — HAPTOGLOBIN: Haptoglobin: <10 mg/dL — ABNORMAL LOW (ref 42–346)

## 2020-07-04 NOTE — Telephone Encounter (Signed)
Called patient to scheduled f/u per los. Spoke with patient and she did not want to schedule f/u. Patient disconnected call. No appt was scheduled

## 2020-07-08 ENCOUNTER — Telehealth: Payer: Self-pay | Admitting: Hematology and Oncology

## 2020-07-08 NOTE — Telephone Encounter (Signed)
Scheduled per los. Called, not able to leave msg. Mailed printout  

## 2020-08-26 DIAGNOSIS — H9193 Unspecified hearing loss, bilateral: Secondary | ICD-10-CM | POA: Diagnosis not present

## 2020-08-26 DIAGNOSIS — R4189 Other symptoms and signs involving cognitive functions and awareness: Secondary | ICD-10-CM | POA: Diagnosis not present

## 2020-08-26 DIAGNOSIS — Z9181 History of falling: Secondary | ICD-10-CM | POA: Diagnosis not present

## 2020-08-26 DIAGNOSIS — R413 Other amnesia: Secondary | ICD-10-CM | POA: Diagnosis not present

## 2020-08-26 DIAGNOSIS — C8307 Small cell B-cell lymphoma, spleen: Secondary | ICD-10-CM | POA: Diagnosis not present

## 2020-08-26 DIAGNOSIS — D591 Autoimmune hemolytic anemia, unspecified: Secondary | ICD-10-CM | POA: Diagnosis not present

## 2020-08-26 DIAGNOSIS — E039 Hypothyroidism, unspecified: Secondary | ICD-10-CM | POA: Diagnosis not present

## 2020-08-26 DIAGNOSIS — Z681 Body mass index (BMI) 19 or less, adult: Secondary | ICD-10-CM | POA: Diagnosis not present

## 2020-08-26 DIAGNOSIS — I10 Essential (primary) hypertension: Secondary | ICD-10-CM | POA: Diagnosis not present

## 2020-09-03 ENCOUNTER — Inpatient Hospital Stay: Payer: No Typology Code available for payment source

## 2020-09-03 ENCOUNTER — Inpatient Hospital Stay: Payer: No Typology Code available for payment source | Admitting: Hematology and Oncology

## 2020-09-15 ENCOUNTER — Telehealth: Payer: Self-pay | Admitting: Hematology and Oncology

## 2020-09-15 NOTE — Telephone Encounter (Signed)
R/s 12/1 appt per provider on call schedule. Called and left msg with new appt date and time

## 2020-09-17 ENCOUNTER — Inpatient Hospital Stay: Payer: No Typology Code available for payment source

## 2020-09-17 ENCOUNTER — Inpatient Hospital Stay: Payer: No Typology Code available for payment source | Admitting: Hematology and Oncology

## 2020-09-24 ENCOUNTER — Inpatient Hospital Stay: Payer: No Typology Code available for payment source | Attending: Physician Assistant

## 2020-09-24 ENCOUNTER — Inpatient Hospital Stay: Payer: No Typology Code available for payment source | Admitting: Hematology and Oncology

## 2020-09-24 ENCOUNTER — Other Ambulatory Visit: Payer: Self-pay | Admitting: Hematology and Oncology

## 2020-09-24 DIAGNOSIS — D5911 Warm autoimmune hemolytic anemia: Secondary | ICD-10-CM

## 2020-09-26 DIAGNOSIS — J309 Allergic rhinitis, unspecified: Secondary | ICD-10-CM | POA: Diagnosis not present

## 2020-09-26 DIAGNOSIS — E039 Hypothyroidism, unspecified: Secondary | ICD-10-CM | POA: Diagnosis not present

## 2020-09-26 DIAGNOSIS — C8307 Small cell B-cell lymphoma, spleen: Secondary | ICD-10-CM | POA: Diagnosis not present

## 2020-09-26 DIAGNOSIS — R413 Other amnesia: Secondary | ICD-10-CM | POA: Diagnosis not present

## 2020-09-26 DIAGNOSIS — Z6821 Body mass index (BMI) 21.0-21.9, adult: Secondary | ICD-10-CM | POA: Diagnosis not present

## 2020-09-26 DIAGNOSIS — D591 Autoimmune hemolytic anemia, unspecified: Secondary | ICD-10-CM | POA: Diagnosis not present

## 2020-11-10 ENCOUNTER — Ambulatory Visit: Payer: Medicare Other | Admitting: Neurology

## 2021-01-13 ENCOUNTER — Encounter: Payer: Self-pay | Admitting: Neurology

## 2021-01-13 ENCOUNTER — Ambulatory Visit: Payer: Medicare Other | Admitting: Neurology

## 2021-01-13 ENCOUNTER — Telehealth: Payer: Self-pay

## 2021-01-13 NOTE — Telephone Encounter (Signed)
Pt no showed 01/13/21 appt with Dr. Rexene Alberts.

## 2021-05-31 ENCOUNTER — Other Ambulatory Visit: Payer: Self-pay

## 2021-05-31 ENCOUNTER — Emergency Department (HOSPITAL_COMMUNITY): Payer: Medicare Other

## 2021-05-31 ENCOUNTER — Emergency Department (HOSPITAL_COMMUNITY)
Admission: EM | Admit: 2021-05-31 | Discharge: 2021-05-31 | Disposition: A | Discharge status: Home / Self Care | Payer: Medicare Other | Attending: Emergency Medicine | Admitting: Emergency Medicine

## 2021-05-31 DIAGNOSIS — Y92410 Unspecified street and highway as the place of occurrence of the external cause: Secondary | ICD-10-CM | POA: Insufficient documentation

## 2021-05-31 DIAGNOSIS — M19011 Primary osteoarthritis, right shoulder: Secondary | ICD-10-CM | POA: Diagnosis not present

## 2021-05-31 DIAGNOSIS — M25562 Pain in left knee: Secondary | ICD-10-CM | POA: Diagnosis not present

## 2021-05-31 DIAGNOSIS — Z041 Encounter for examination and observation following transport accident: Secondary | ICD-10-CM | POA: Diagnosis not present

## 2021-05-31 DIAGNOSIS — Z87891 Personal history of nicotine dependence: Secondary | ICD-10-CM | POA: Diagnosis not present

## 2021-05-31 DIAGNOSIS — Z8579 Personal history of other malignant neoplasms of lymphoid, hematopoietic and related tissues: Secondary | ICD-10-CM | POA: Diagnosis not present

## 2021-05-31 DIAGNOSIS — M25511 Pain in right shoulder: Secondary | ICD-10-CM | POA: Insufficient documentation

## 2021-05-31 DIAGNOSIS — K5900 Constipation, unspecified: Secondary | ICD-10-CM | POA: Diagnosis not present

## 2021-05-31 DIAGNOSIS — S8002XA Contusion of left knee, initial encounter: Secondary | ICD-10-CM | POA: Insufficient documentation

## 2021-05-31 DIAGNOSIS — S8991XA Unspecified injury of right lower leg, initial encounter: Secondary | ICD-10-CM | POA: Diagnosis present

## 2021-05-31 DIAGNOSIS — M25512 Pain in left shoulder: Secondary | ICD-10-CM | POA: Diagnosis not present

## 2021-05-31 DIAGNOSIS — M25561 Pain in right knee: Secondary | ICD-10-CM | POA: Diagnosis not present

## 2021-05-31 DIAGNOSIS — Z79899 Other long term (current) drug therapy: Secondary | ICD-10-CM | POA: Diagnosis not present

## 2021-05-31 DIAGNOSIS — F0391 Unspecified dementia with behavioral disturbance: Secondary | ICD-10-CM | POA: Diagnosis not present

## 2021-05-31 DIAGNOSIS — E039 Hypothyroidism, unspecified: Secondary | ICD-10-CM | POA: Diagnosis not present

## 2021-05-31 DIAGNOSIS — Z85828 Personal history of other malignant neoplasm of skin: Secondary | ICD-10-CM | POA: Diagnosis not present

## 2021-05-31 DIAGNOSIS — Z91040 Latex allergy status: Secondary | ICD-10-CM | POA: Insufficient documentation

## 2021-05-31 DIAGNOSIS — I10 Essential (primary) hypertension: Secondary | ICD-10-CM | POA: Insufficient documentation

## 2021-05-31 DIAGNOSIS — S8001XA Contusion of right knee, initial encounter: Secondary | ICD-10-CM | POA: Diagnosis not present

## 2021-05-31 DIAGNOSIS — M85862 Other specified disorders of bone density and structure, left lower leg: Secondary | ICD-10-CM | POA: Diagnosis not present

## 2021-05-31 DIAGNOSIS — M25569 Pain in unspecified knee: Secondary | ICD-10-CM | POA: Diagnosis not present

## 2021-05-31 MED ORDER — ACETAMINOPHEN 325 MG PO TABS: 650.0000 mg | ORAL_TABLET | Freq: Four times a day (QID) | ORAL | 0 refills | Status: AC | PRN

## 2021-05-31 MED ORDER — LIDOCAINE 5 % EX PTCH
1.0000 | MEDICATED_PATCH | CUTANEOUS | Status: DC
  Administered 2021-05-31: 1 via TRANSDERMAL
  Filled 2021-05-31: qty 1

## 2021-05-31 MED ORDER — LIDOCAINE 5 % EX PTCH: 1.0000 | MEDICATED_PATCH | CUTANEOUS | 0 refills | Status: AC

## 2021-05-31 MED ORDER — IBUPROFEN 200 MG PO TABS
600.0000 mg | ORAL_TABLET | Freq: Once | ORAL | Status: DC
  Filled 2021-05-31: qty 3
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
## 2021-09-23 DIAGNOSIS — Z6822 Body mass index (BMI) 22.0-22.9, adult: Secondary | ICD-10-CM | POA: Diagnosis not present

## 2021-09-23 DIAGNOSIS — J309 Allergic rhinitis, unspecified: Secondary | ICD-10-CM | POA: Diagnosis not present

## 2021-09-23 DIAGNOSIS — N183 Chronic kidney disease, stage 3 unspecified: Secondary | ICD-10-CM | POA: Diagnosis not present

## 2021-09-23 DIAGNOSIS — C8307 Small cell B-cell lymphoma, spleen: Secondary | ICD-10-CM | POA: Diagnosis not present

## 2021-09-23 DIAGNOSIS — R413 Other amnesia: Secondary | ICD-10-CM | POA: Diagnosis not present

## 2021-09-23 DIAGNOSIS — D591 Autoimmune hemolytic anemia, unspecified: Secondary | ICD-10-CM | POA: Diagnosis not present

## 2021-09-23 DIAGNOSIS — E039 Hypothyroidism, unspecified: Secondary | ICD-10-CM | POA: Diagnosis not present

## 2023-08-12 DIAGNOSIS — Z139 Encounter for screening, unspecified: Secondary | ICD-10-CM | POA: Diagnosis not present

## 2023-08-12 DIAGNOSIS — E039 Hypothyroidism, unspecified: Secondary | ICD-10-CM | POA: Diagnosis not present

## 2023-08-12 DIAGNOSIS — Z2821 Immunization not carried out because of patient refusal: Secondary | ICD-10-CM | POA: Diagnosis not present

## 2023-08-12 DIAGNOSIS — Z9181 History of falling: Secondary | ICD-10-CM | POA: Diagnosis not present
